# Patient Record
Sex: Male | Born: 1995 | Race: Black or African American | Hispanic: No | Marital: Single | Smoking: Current every day smoker
Health system: Southern US, Community
[De-identification: ages and names within clinical notes are randomized; demographics above are authoritative.]

## PROBLEM LIST (undated history)

## (undated) DIAGNOSIS — J45909 Unspecified asthma, uncomplicated: Secondary | ICD-10-CM

---
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
## 2017-02-13 ENCOUNTER — Emergency Department (HOSPITAL_COMMUNITY): Payer: Self-pay

## 2017-02-13 ENCOUNTER — Encounter (HOSPITAL_COMMUNITY): Payer: Self-pay

## 2017-02-13 ENCOUNTER — Emergency Department (HOSPITAL_COMMUNITY)
Admission: EM | Admit: 2017-02-13 | Discharge: 2017-02-13 | Disposition: A | Discharge status: Home / Self Care | Payer: Self-pay | Attending: Emergency Medicine | Admitting: Emergency Medicine

## 2017-02-13 DIAGNOSIS — J111 Influenza due to unidentified influenza virus with other respiratory manifestations: Secondary | ICD-10-CM | POA: Insufficient documentation

## 2017-02-13 DIAGNOSIS — R10817 Generalized abdominal tenderness: Secondary | ICD-10-CM | POA: Insufficient documentation

## 2017-02-13 DIAGNOSIS — R112 Nausea with vomiting, unspecified: Secondary | ICD-10-CM

## 2017-02-13 LAB — CBC WITH DIFFERENTIAL/PLATELET
BASOS ABS: 0 10*3/uL (ref 0.0–0.1)
Basophils Relative: 0 %
EOS PCT: 0 %
Eosinophils Absolute: 0 10*3/uL (ref 0.0–0.7)
HCT: 47.8 % (ref 39.0–52.0)
Hemoglobin: 17.2 g/dL — ABNORMAL HIGH (ref 13.0–17.0)
LYMPHS ABS: 1.3 10*3/uL (ref 0.7–4.0)
LYMPHS PCT: 9 %
MCH: 30.7 pg (ref 26.0–34.0)
MCHC: 36 g/dL (ref 30.0–36.0)
MCV: 85.4 fL (ref 78.0–100.0)
MONO ABS: 1.2 10*3/uL — AB (ref 0.1–1.0)
MONOS PCT: 9 %
NEUTROS ABS: 11.2 10*3/uL — AB (ref 1.7–7.7)
Neutrophils Relative %: 82 %
PLATELETS: 179 10*3/uL (ref 150–400)
RBC: 5.6 MIL/uL (ref 4.22–5.81)
RDW: 12.6 % (ref 11.5–15.5)
WBC: 13.7 10*3/uL — ABNORMAL HIGH (ref 4.0–10.5)

## 2017-02-13 LAB — COMPREHENSIVE METABOLIC PANEL
ALBUMIN: 4.6 g/dL (ref 3.5–5.0)
ALT: 21 U/L (ref 17–63)
ANION GAP: 17 — AB (ref 5–15)
AST: 24 U/L (ref 15–41)
Alkaline Phosphatase: 44 U/L (ref 38–126)
BILIRUBIN TOTAL: 0.7 mg/dL (ref 0.3–1.2)
BUN: 17 mg/dL (ref 6–20)
CHLORIDE: 94 mmol/L — AB (ref 101–111)
CO2: 25 mmol/L (ref 22–32)
Calcium: 9.5 mg/dL (ref 8.9–10.3)
Creatinine, Ser: 1.36 mg/dL — ABNORMAL HIGH (ref 0.61–1.24)
GFR calc Af Amer: >60 mL/min (ref >60)
Glucose, Bld: 101 mg/dL — ABNORMAL HIGH (ref 65–99)
POTASSIUM: 3.1 mmol/L — AB (ref 3.5–5.1)
Sodium: 136 mmol/L (ref 135–145)
TOTAL PROTEIN: 7.9 g/dL (ref 6.5–8.1)

## 2017-02-13 LAB — URINALYSIS, ROUTINE W REFLEX MICROSCOPIC
Bacteria, UA: NONE SEEN
Bilirubin Urine: NEGATIVE
Glucose, UA: NEGATIVE mg/dL
HGB URINE DIPSTICK: NEGATIVE
Ketones, ur: 20 mg/dL — AB
LEUKOCYTES UA: NEGATIVE
Nitrite: NEGATIVE
Protein, ur: 30 mg/dL — AB
SPECIFIC GRAVITY, URINE: 1.015 (ref 1.005–1.030)
pH: 7 (ref 5.0–8.0)

## 2017-02-13 MED ORDER — SODIUM CHLORIDE 0.9 % IV BOLUS (SEPSIS)
1000.0000 mL | Freq: Once | INTRAVENOUS | Status: AC
  Administered 2017-02-13: 1000 mL via INTRAVENOUS

## 2017-02-13 MED ORDER — PROMETHAZINE HCL 25 MG PO TABS: 25.0000 mg | ORAL_TABLET | Freq: Four times a day (QID) | ORAL | 0 refills | Status: DC | PRN

## 2017-02-13 MED ORDER — PROMETHAZINE HCL 25 MG/ML IJ SOLN
12.5000 mg | Freq: Once | INTRAMUSCULAR | Status: AC
  Administered 2017-02-13: 12.5 mg via INTRAVENOUS
  Filled 2017-02-13: qty 1

## 2017-02-13 MED ORDER — KETOROLAC TROMETHAMINE 30 MG/ML IJ SOLN
30.0000 mg | Freq: Once | INTRAMUSCULAR | Status: AC
  Administered 2017-02-13: 30 mg via INTRAVENOUS
  Filled 2017-02-13: qty 1

## 2017-02-13 MED ORDER — HALOPERIDOL LACTATE 5 MG/ML IJ SOLN
2.0000 mg | Freq: Once | INTRAMUSCULAR | Status: AC
  Administered 2017-02-13: 2 mg via INTRAVENOUS
  Filled 2017-02-13: qty 1

## 2017-02-13 MED ORDER — IBUPROFEN 600 MG PO TABS: 600.0000 mg | ORAL_TABLET | Freq: Four times a day (QID) | ORAL | 0 refills | Status: DC | PRN

## 2017-02-13 MED ORDER — ACETAMINOPHEN 325 MG PO TABS
650.0000 mg | ORAL_TABLET | Freq: Once | ORAL | Status: AC
  Administered 2017-02-13: 650 mg via ORAL
  Filled 2017-02-13: qty 2

## 2017-02-13 NOTE — Discharge Instructions (Signed)
Take phenergan as prescribed as needed for nausea and vomiting. Continue tamiflu. Avoid smoking marijuana. Ibuprofen and tylenol for pain. Return if worsening symptoms, unable to keep anything down.

## 2017-02-13 NOTE — ED Triage Notes (Signed)
Pt states he was dx with influenza yesterday. Has had symptoms X5 days. c/o vomiting, chest pain, back pain, and abd pain.

## 2017-02-13 NOTE — ED Notes (Signed)
Pt called out x2 for nausea meds -- PA notified. Pt sts "I threw up a little in the trash can"

## 2017-02-13 NOTE — ED Notes (Signed)
Pt given ginger ale per request PA

## 2017-02-13 NOTE — ED Notes (Signed)
Pt stable, ambulatory, states understanding of discharge instructions 

## 2017-02-13 NOTE — ED Provider Notes (Signed)
[facility]-EMERGENCY DEPT Provider Note   CSN: [id number] Arrival date & time: 02/13/17  5621     History   Chief Complaint No chief complaint on file.   HPI Blake Jennings is a 21 y.o. male.  HPI Blake Jennings is a 21 y.o. male with no medical problems, presents to emergency department complaining of nausea and vomiting. Patient states he developed symptoms 3 days ago. He went to urgent care yesterday where he tested positive for influenza a. He was given Zofran prescription and Tamiflu. He states that he is having mild nasal congestion, cough, nausea, vomiting, diarrhea just 2 days ago which now resolved. He states he has had approximately 20 episodes of emesis since yesterday. He is unable to keep anything down. He was given prescription for 10 x 4mg  Zofran's and he states he took all of them since last night. He had no relief with Zofran. He has not tried taking any Tylenol or Motrin. He reports pain to his chest, back, abdomen. He admits to smoking marijuana. He denies any fever. He admits to chills. Denies any blood in his stool or emesis.    No past medical history on file.  There are no active problems to display for this patient.   No past surgical history on file.     Home Medications    Prior to Admission medications   Medication Sig Start Date End Date Taking? Authorizing Provider  acetaminophen (TYLENOL) 500 MG tablet Take 500 mg by mouth every 6 (six) hours as needed for mild pain.    Historical Provider, MD  ibuprofen (ADVIL,MOTRIN) 200 MG tablet Take 200 mg by mouth every 6 (six) hours as needed for moderate pain.    Historical Provider, MD  promethazine (PHENERGAN) 25 MG tablet Take 1 tablet (25 mg total) by mouth every 6 (six) hours as needed for nausea or vomiting. 07/21/15   Laurence Spates, MD    Family History No family history on file.  Social History Social History  Substance Use Topics  . Smoking status: Never Smoker  . Smokeless  tobacco: Not on file  . Alcohol use No     Allergies   Patient has no known allergies.   Review of Systems Review of Systems  Constitutional: Positive for chills. Negative for fever.  HENT: Positive for congestion.   Respiratory: Positive for cough. Negative for chest tightness and shortness of breath.   Cardiovascular: Negative for chest pain, palpitations and leg swelling.  Gastrointestinal: Positive for abdominal pain, nausea and vomiting. Negative for abdominal distention and diarrhea.  Genitourinary: Negative for dysuria, frequency, hematuria and urgency.  Musculoskeletal: Positive for arthralgias, back pain and myalgias. Negative for neck pain and neck stiffness.  Skin: Negative for rash.  Allergic/Immunologic: Negative for immunocompromised state.  Neurological: Negative for dizziness, weakness, light-headedness, numbness and headaches.  All other systems reviewed and are negative.    Physical Exam Updated Vital Signs BP (!) 142/90 (BP Location: Right Arm)   Pulse 73   Temp 97.7 F (36.5 C) (Oral)   Resp (!) 22   SpO2 98%   Physical Exam  Constitutional: He appears well-developed and well-nourished. No distress.  HENT:  Head: Normocephalic and atraumatic.  Eyes: Conjunctivae are normal.  Neck: Neck supple.  Cardiovascular: Normal rate, regular rhythm and normal heart sounds.   Pulmonary/Chest: Effort normal. No respiratory distress. He has no wheezes. He has no rales.  Abdominal: Soft. Bowel sounds are normal. He exhibits no distension. There is tenderness. There is no  rebound and no guarding.  Diffuse tenderness  Musculoskeletal: He exhibits no edema.  Neurological: He is alert.  Skin: Skin is warm and dry.  Nursing note and vitals reviewed.    ED Treatments / Results  Labs (all labs ordered are listed, but only abnormal results are displayed) Labs Reviewed  CBC WITH DIFFERENTIAL/PLATELET - Abnormal; Notable for the following:       Result Value   WBC  13.7 (*)    Hemoglobin 17.2 (*)    Neutro Abs 11.2 (*)    Monocytes Absolute 1.2 (*)    All other components within normal limits  COMPREHENSIVE METABOLIC PANEL - Abnormal; Notable for the following:    Potassium 3.1 (*)    Chloride 94 (*)    Glucose, Bld 101 (*)    Creatinine, Ser 1.36 (*)    Anion gap 17 (*)    All other components within normal limits  URINALYSIS, ROUTINE W REFLEX MICROSCOPIC - Abnormal; Notable for the following:    Ketones, ur 20 (*)    Protein, ur 30 (*)    Squamous Epithelial / LPF 0-5 (*)    All other components within normal limits    EKG  EKG Interpretation  Date/Time:  Sunday February 13 2017 09:39:10 EDT Ventricular Rate:  71 PR Interval:    QRS Duration: 82 QT Interval:  401 QTC Calculation: 436 R Axis:   84 Text Interpretation:  Sinus rhythm Borderline Q waves in lateral leads Borderline ST elevation, lateral leads No significant change since last tracing Confirmed by LITTLE MD, RACHEL [phone]) on 02/13/2017 10:01:53 AM       Radiology Dg Chest 2 View  Result Date: 02/13/2017 CLINICAL DATA:  Influenza.  Chest pain EXAM: CHEST  2 VIEW COMPARISON:  July 21, 2015 FINDINGS: Lungs are clear. Heart size and pulmonary vascularity are normal. No adenopathy. No pneumothorax. No bone lesions. IMPRESSION: No edema or consolidation. Electronically Signed   By: Bretta Bang III M.D.   On: 02/13/2017 10:04    Procedures Procedures (including critical care time)  Medications Ordered in ED Medications  promethazine (PHENERGAN) injection 12.5 mg (12.5 mg Intravenous Given 02/13/17 0932)  sodium chloride 0.9 % bolus 1,000 mL (0 mLs Intravenous Stopped 02/13/17 1017)  ketorolac (TORADOL) 30 MG/ML injection 30 mg (30 mg Intravenous Given 02/13/17 0934)  sodium chloride 0.9 % bolus 1,000 mL (0 mLs Intravenous Stopped 02/13/17 1150)  haloperidol lactate (HALDOL) injection 2 mg (2 mg Intravenous Given 02/13/17 1318)  sodium chloride 0.9 % bolus 1,000 mL (0 mLs  Intravenous Stopped 02/13/17 1452)  acetaminophen (TYLENOL) tablet 650 mg (650 mg Oral Given 02/13/17 1402)     Initial Impression / Assessment and Plan / ED Course  I have reviewed the triage vital signs and the nursing notes.  Pertinent labs & imaging results that were available during my care of the patient were reviewed by me and considered in my medical decision making (see chart for details).    Patient diagnosed with influenza yesterday, currently on Tamiflu and Zofran. He presents to emergency room for intractable vomiting. He is rolling around in bed moaning, normal vital signs, abdomen is benign. He does admit to smoking marijuana and admits to similar symptoms in the past. We will check labs, hydrate, try Phenergan for nausea and vomiting since patient took too many Zofran at home. We'll check an EKG.   Patient initially improved with IV fluids and Phenergan. He took a few sips of ginger ale and began having  nausea again. He again rolling around in bed moaning. Grandma at bedside, states patient sits in the hot shower all day and does not come out. I question whether his vomiting is due to [facility] induced cyclical vomiting. We will try Haldol.   3:44 PM Feels better after Haldol. Drinking ginger ale. Drank a cup of water and now drinking second cup ginger ale. Plan to discharge home. Instructed to continue Tamiflu, will give prescription for Phenergan, return precautions discussed. Potassium slightly low, advised to increase potassium intake. Most likely from vomiting. VS normal. Pt stable for dc home.   Vitals:   02/13/17 1200 02/13/17 1330 02/13/17 1500 02/13/17 1516  BP: 125/75 140/84 (!) 110/49 120/60  Pulse: (!) 57 (!) 55 63 (!) 57  Resp: 16 16 18    Temp:      TempSrc:      SpO2: 96% 99% 98% 100%     Final Clinical Impressions(s) / ED Diagnoses   Final diagnoses:  Nausea and vomiting, intractability of vomiting not specified, unspecified vomiting type  Influenza     New Prescriptions Discharge Medication List as of 02/13/2017  3:11 PM    START taking these medications   Details  !! ibuprofen (ADVIL,MOTRIN) 600 MG tablet Take 1 tablet (600 mg total) by mouth every 6 (six) hours as needed., Starting Sun 02/13/2017, Print    !! promethazine (PHENERGAN) 25 MG tablet Take 1 tablet (25 mg total) by mouth every 6 (six) hours as needed for nausea or vomiting., Starting Sun 02/13/2017, Print     !! - Potential duplicate medications found. Please discuss with provider.       Jaynie Crumbleatyana Irmgard Rampersaud, PA-C 02/13/17 1545    Laurence Spatesachel Morgan Little, MD 02/13/17 [phone]

## 2017-02-14 ENCOUNTER — Emergency Department (HOSPITAL_COMMUNITY)
Admission: EM | Admit: 2017-02-14 | Discharge: 2017-02-14 | Disposition: A | Discharge status: Home / Self Care | Payer: Medicaid Other | Attending: Emergency Medicine | Admitting: Emergency Medicine

## 2017-02-14 ENCOUNTER — Encounter (HOSPITAL_COMMUNITY): Payer: Self-pay | Admitting: Emergency Medicine

## 2017-02-14 ENCOUNTER — Encounter (HOSPITAL_COMMUNITY): Payer: Self-pay

## 2017-02-14 DIAGNOSIS — F17210 Nicotine dependence, cigarettes, uncomplicated: Secondary | ICD-10-CM | POA: Insufficient documentation

## 2017-02-14 DIAGNOSIS — E860 Dehydration: Secondary | ICD-10-CM | POA: Insufficient documentation

## 2017-02-14 DIAGNOSIS — J45909 Unspecified asthma, uncomplicated: Secondary | ICD-10-CM | POA: Insufficient documentation

## 2017-02-14 DIAGNOSIS — R112 Nausea with vomiting, unspecified: Secondary | ICD-10-CM

## 2017-02-14 DIAGNOSIS — J101 Influenza due to other identified influenza virus with other respiratory manifestations: Secondary | ICD-10-CM

## 2017-02-14 DIAGNOSIS — M62838 Other muscle spasm: Secondary | ICD-10-CM | POA: Insufficient documentation

## 2017-02-14 DIAGNOSIS — Z79899 Other long term (current) drug therapy: Secondary | ICD-10-CM | POA: Insufficient documentation

## 2017-02-14 DIAGNOSIS — J09X2 Influenza due to identified novel influenza A virus with other respiratory manifestations: Secondary | ICD-10-CM | POA: Insufficient documentation

## 2017-02-14 HISTORY — DX: Unspecified asthma, uncomplicated: J45.909

## 2017-02-14 LAB — BASIC METABOLIC PANEL
ANION GAP: 6 (ref 5–15)
BUN: 9 mg/dL (ref 6–20)
CALCIUM: 8.7 mg/dL — AB (ref 8.9–10.3)
CO2: 25 mmol/L (ref 22–32)
CREATININE: 0.94 mg/dL (ref 0.61–1.24)
Chloride: 106 mmol/L (ref 101–111)
Glucose, Bld: 80 mg/dL (ref 65–99)
Potassium: 3.7 mmol/L (ref 3.5–5.1)
SODIUM: 137 mmol/L (ref 135–145)

## 2017-02-14 LAB — CBC WITH DIFFERENTIAL/PLATELET
BASOS ABS: 0.1 10*3/uL (ref 0.0–0.1)
Basophils Absolute: 0 10*3/uL (ref 0.0–0.1)
Basophils Relative: 1 %
Basophils Relative: 0 %
EOS ABS: 0.1 10*3/uL (ref 0.0–0.7)
Eosinophils Absolute: 0 10*3/uL (ref 0.0–0.7)
Eosinophils Relative: 0 %
Eosinophils Relative: 1 %
HCT: 40.6 % (ref 39.0–52.0)
HEMATOCRIT: 44.8 % (ref 39.0–52.0)
Hemoglobin: 15.7 g/dL (ref 13.0–17.0)
Hemoglobin: 14.3 g/dL (ref 13.0–17.0)
LYMPHS ABS: 1.6 10*3/uL (ref 0.7–4.0)
LYMPHS ABS: 3.7 10*3/uL (ref 0.7–4.0)
Lymphocytes Relative: 19 %
Lymphocytes Relative: 40 %
MCH: 30.5 pg (ref 26.0–34.0)
MCH: 30.4 pg (ref 26.0–34.0)
MCHC: 35 g/dL (ref 30.0–36.0)
MCHC: 35.2 g/dL (ref 30.0–36.0)
MCV: 87 fL (ref 78.0–100.0)
MCV: 86.2 fL (ref 78.0–100.0)
MONO ABS: 0.8 10*3/uL (ref 0.1–1.0)
MONO ABS: 0.8 10*3/uL (ref 0.1–1.0)
Monocytes Relative: 10 %
Monocytes Relative: 9 %
NEUTROS ABS: 5.8 10*3/uL (ref 1.7–7.7)
NEUTROS PCT: 50 %
Neutro Abs: 4.6 10*3/uL (ref 1.7–7.7)
Neutrophils Relative %: 70 %
PLATELETS: 167 10*3/uL (ref 150–400)
PLATELETS: 177 10*3/uL (ref 150–400)
RBC: 5.15 MIL/uL (ref 4.22–5.81)
RBC: 4.71 MIL/uL (ref 4.22–5.81)
RDW: 12.8 % (ref 11.5–15.5)
RDW: 12.6 % (ref 11.5–15.5)
WBC: 8.3 10*3/uL (ref 4.0–10.5)
WBC: 9.2 10*3/uL (ref 4.0–10.5)

## 2017-02-14 LAB — COMPREHENSIVE METABOLIC PANEL
ALBUMIN: 4 g/dL (ref 3.5–5.0)
ALT: 19 U/L (ref 17–63)
ANION GAP: 12 (ref 5–15)
AST: 20 U/L (ref 15–41)
Alkaline Phosphatase: 36 U/L — ABNORMAL LOW (ref 38–126)
BILIRUBIN TOTAL: 0.9 mg/dL (ref 0.3–1.2)
BUN: 10 mg/dL (ref 6–20)
CHLORIDE: 97 mmol/L — AB (ref 101–111)
CO2: 27 mmol/L (ref 22–32)
Calcium: 8.7 mg/dL — ABNORMAL LOW (ref 8.9–10.3)
Creatinine, Ser: 1.02 mg/dL (ref 0.61–1.24)
GFR calc Af Amer: >60 mL/min (ref >60)
GLUCOSE: 96 mg/dL (ref 65–99)
POTASSIUM: 3.2 mmol/L — AB (ref 3.5–5.1)
Sodium: 136 mmol/L (ref 135–145)
TOTAL PROTEIN: 6.8 g/dL (ref 6.5–8.1)

## 2017-02-14 LAB — CK: CK TOTAL: 297 U/L (ref 49–397)

## 2017-02-14 LAB — TROPONIN I: Troponin I: <0.03 ng/mL (ref <0.03)

## 2017-02-14 LAB — LIPASE, BLOOD

## 2017-02-14 MED ORDER — HALOPERIDOL LACTATE 5 MG/ML IJ SOLN
5.0000 mg | Freq: Once | INTRAMUSCULAR | Status: AC
  Administered 2017-02-14: 5 mg via INTRAVENOUS
  Filled 2017-02-14: qty 1

## 2017-02-14 MED ORDER — DEXTROSE-NACL 5-0.45 % IV SOLN
INTRAVENOUS | Status: DC
  Administered 2017-02-14: 11:00:00 via INTRAVENOUS

## 2017-02-14 MED ORDER — SODIUM CHLORIDE 0.9 % IV BOLUS (SEPSIS)
1000.0000 mL | Freq: Once | INTRAVENOUS | Status: AC
  Administered 2017-02-14: 1000 mL via INTRAVENOUS

## 2017-02-14 MED ORDER — ONDANSETRON 4 MG PO TBDP: 4.0000 mg | ORAL_TABLET | Freq: Three times a day (TID) | ORAL | 0 refills | Status: DC | PRN

## 2017-02-14 MED ORDER — DIPHENHYDRAMINE HCL 50 MG/ML IJ SOLN
50.0000 mg | Freq: Once | INTRAMUSCULAR | Status: AC
  Administered 2017-02-14: 50 mg via INTRAVENOUS
  Filled 2017-02-14: qty 1

## 2017-02-14 MED ORDER — ALBUTEROL SULFATE HFA 108 (90 BASE) MCG/ACT IN AERS
2.0000 | INHALATION_SPRAY | Freq: Once | RESPIRATORY_TRACT | Status: AC
  Administered 2017-02-14: 2 via RESPIRATORY_TRACT
  Filled 2017-02-14: qty 6.7

## 2017-02-14 MED ORDER — DIAZEPAM 5 MG/ML IJ SOLN
5.0000 mg | Freq: Once | INTRAMUSCULAR | Status: AC
  Administered 2017-02-14: 5 mg via INTRAVENOUS
  Filled 2017-02-14: qty 2

## 2017-02-14 MED ORDER — POTASSIUM CHLORIDE CRYS ER 20 MEQ PO TBCR
40.0000 meq | EXTENDED_RELEASE_TABLET | Freq: Once | ORAL | Status: AC
  Administered 2017-02-14: 40 meq via ORAL
  Filled 2017-02-14: qty 2

## 2017-02-14 NOTE — ED Notes (Signed)
Pt placed on droplet precautions due to being started on Tamiflu on Friday for the flu at urgent care

## 2017-02-14 NOTE — ED Triage Notes (Signed)
Patient requesting re-evaluation of ongoing cough and nausea with vomiting. States that he is continuing to vomit with any intake and taking zofran and phenergan with no relief. Alert and oriented, NAD

## 2017-02-14 NOTE — ED Triage Notes (Signed)
Per EMS, patient has had flu x3 days. Patient has been [facility] ER today. Patient is complaining of generalized body aches and congestion. Patient is also complaining of leg cramps/spasms.

## 2017-02-14 NOTE — ED Provider Notes (Signed)
[facility]-EMERGENCY DEPT Provider Note   CSN: [id number] Arrival date & time: 02/14/17  1904  By signing my name below, I, Blake Jennings, attest that this documentation has been prepared under the direction and in the presence of [name], PA-C.  Electronically Signed: Rosario AdieWilliam Andrew Jennings, ED Scribe. 02/14/17. 8:02 PM.  History   Chief Complaint Chief Complaint  Patient presents with  . Flu Like Symptoms   The history is provided by the patient, a relative and medical records. No language interpreter was used.   HPI Comments: Nada Blake Jennings is a 21 y.o. male brought in by ambulance, with a h/o asthma, who presents to the Emergency Department complaining of persistent leg spasming beginning approximately 4-5 hours ago. He reports associated lower back tightness secondary to this issue. Pt recently tested positive for influenza A. He was prescribed Tamiflu and Zofran at that time which he began taking yesterday with significant improvement of all of his previous symptoms. He was also seen yesterday and again this morning in the ED for persistent nausea which has resolved with IV NSL bolus and Haldol; CBC, CMP, Lipase, and Troponin were tested this morning and were WNL. Shortly following being seen in the ED most recently he states that he began having an acute onset of bilateral lower leg muscle spasms and pain to the area which has been persistent. No h/o similar symptoms. No h/o seizures. His symptoms are alleviated with standing and movement of the legs.Grandparent denies speech changes, confusion. He denies headache, rash, syncope, visual disturbance, or any other associated symptoms.   Past Medical History:  Diagnosis Date  . Asthma    There are no active problems to display for this patient.  History reviewed. No pertinent surgical history.  Home Medications    Prior to Admission medications   Medication Sig Start Date End Date Taking? Authorizing Provider    acetaminophen (TYLENOL) 500 MG tablet Take 500 mg by mouth every 6 (six) hours as needed for mild pain.    Historical Provider, MD  ibuprofen (ADVIL,MOTRIN) 200 MG tablet Take 200 mg by mouth every 6 (six) hours as needed for moderate pain.    Historical Provider, MD  ibuprofen (ADVIL,MOTRIN) 600 MG tablet Take 1 tablet (600 mg total) by mouth every 6 (six) hours as needed. 02/13/17   Tatyana Kirichenko, PA-C  ondansetron (ZOFRAN ODT) 4 MG disintegrating tablet Take 1 tablet (4 mg total) by mouth every 8 (eight) hours as needed for nausea or vomiting. 02/14/17   Alvira MondayErin Schlossman, MD  promethazine (PHENERGAN) 25 MG tablet Take 1 tablet (25 mg total) by mouth every 6 (six) hours as needed for nausea or vomiting. 07/21/15   Laurence Spatesachel Morgan Little, MD  promethazine (PHENERGAN) 25 MG tablet Take 1 tablet (25 mg total) by mouth every 6 (six) hours as needed for nausea or vomiting. 02/13/17   Jaynie Crumbleatyana Kirichenko, PA-C   Family History No family history on file.  Social History Social History  Substance Use Topics  . Smoking status: Current Every Day Smoker    Types: Cigarettes  . Smokeless tobacco: Never Used  . Alcohol use No   Allergies   Patient has no known allergies.  Review of Systems Review of Systems  Constitutional: Negative for fever.  HENT: Negative for congestion.   Eyes: Negative for visual disturbance.  Cardiovascular: Negative for chest pain.  Gastrointestinal: Negative for diarrhea, nausea and vomiting.  Musculoskeletal: Positive for back pain (lower) and myalgias.       B/l leg  spasms  Skin: Negative for rash.  Neurological: Negative for seizures and speech difficulty.  Psychiatric/Behavioral: Negative for confusion.   Physical Exam Updated Vital Signs BP 130/63 (BP Location: Right Arm)   Pulse 86   Temp 98.2 F (36.8 C) (Oral)   Resp 18   Ht 6' (1.829 m)   Wt 68.5 kg   SpO2 97%   BMI 20.48 kg/m   Physical Exam  Constitutional: He appears well-developed and  well-nourished. No distress.  HENT:  Head: Normocephalic and atraumatic.  Eyes: Conjunctivae are normal.  Neck: Normal range of motion.  Cardiovascular: Normal rate, regular rhythm, normal heart sounds and intact distal pulses.   No murmur heard. Pulmonary/Chest: Effort normal and breath sounds normal. No respiratory distress. He has no wheezes. He has no rales.  Abdominal: Soft. Bowel sounds are normal. He exhibits no distension. There is no tenderness.  Musculoskeletal: Normal range of motion.  Pts b/l legs w intermittent tonic spasms on exam.   Neurological: He is alert. He displays normal reflexes. No cranial nerve deficit or sensory deficit.  NV intact.    Skin: Skin is warm and dry. No pallor.  Psychiatric: He has a normal mood and affect. His behavior is normal.  Nursing note and vitals reviewed.  ED Treatments / Results  DIAGNOSTIC STUDIES: Oxygen Saturation is 99% on RA, normal by my interpretation.   COORDINATION OF CARE: 7:58 PM-Discussed next steps with pt. Pt verbalized understanding and is agreeable with the plan.   Labs (all labs ordered are listed, but only abnormal results are displayed) Labs Reviewed  BASIC METABOLIC PANEL - Abnormal; Notable for the following:       Result Value   Calcium 8.7 (*)    All other components within normal limits  CK  CBC WITH DIFFERENTIAL/PLATELET    EKG  EKG Interpretation None      Radiology Dg Chest 2 View  Result Date: 02/13/2017 CLINICAL DATA:  Influenza.  Chest pain EXAM: CHEST  2 VIEW COMPARISON:  July 21, 2015 FINDINGS: Lungs are clear. Heart size and pulmonary vascularity are normal. No adenopathy. No pneumothorax. No bone lesions. IMPRESSION: No edema or consolidation. Electronically Signed   By: Bretta Bang III M.D.   On: 02/13/2017 10:04   Procedures Procedures   Medications Ordered in ED Medications  sodium chloride 0.9 % bolus 1,000 mL (0 mLs Intravenous Stopped 02/14/17 2108)  diphenhydrAMINE  (BENADRYL) injection 50 mg (50 mg Intravenous Given 02/14/17 2128)  diazepam (VALIUM) injection 5 mg (5 mg Intravenous Given 02/14/17 2128)    Initial Impression / Assessment and Plan / ED Course  I have reviewed the triage vital signs and the nursing notes.  Pertinent labs & imaging results that were available during my care of the patient were reviewed by me and considered in my medical decision making (see chart for details).     Pt w influenza virus and recent episodes of vomiting, presents w b/l muscular spasms in legs. Normal neuro exam, pt not in distress. Suspect muscle spasms 2/t dehydration vs adverse reaction from Haldol. Labs reassuring. Sx resolved after IVF, benadryl, valium. Provided reassurance, encourage PO fluids. Pt safe for discharge home.   Patient discussed with Dr. Fayrene Fearing.  Discussed results, findings, treatment and follow up. Patient advised of return precautions. Patient verbalized understanding and agreed with plan.   Final Clinical Impressions(s) / ED Diagnoses   Final diagnoses:  Muscle spasm of both lower legs   New Prescriptions New Prescriptions   No  medications on file   I personally performed the services described in this documentation, which was scribed in my presence. The recorded information has been reviewed and is accurate.     [place] Nicole Russo, PA-C 02/14/17 2228    Rolland Porter, MD 02/23/17 [phone]

## 2017-02-14 NOTE — ED Notes (Signed)
Provided warm blanket.

## 2017-02-14 NOTE — ED Notes (Signed)
Pt calmly laying in bed, normal breathing, relaxed and appears comfortable.  Pt has not coughed or thrown up since arrival.

## 2017-02-14 NOTE — ED Provider Notes (Signed)
[facility]-EMERGENCY DEPT Provider Note   CSN: [id number] Arrival date & time: 02/14/17  0751     History   Chief Complaint Chief Complaint  Patient presents with  . emesis/cough    HPI Blake Jennings is a 21 y.o. male.  HPI   Was seen yesterday with n/v/cough.  Symptoms started 5 days ago, 4 days ago went to urgent care, had influenza a, given zofran and tamiflu.  Throwing up 10 times per day, bright red drops of blood in emesis. Diarrhea Friday not after that.  Chest pain from vomiting started Friday.  Taking a shower helps the chest pain and helps the nausea a little bit. Not coughing up anything, feels cough is unchanged. No fevers now. Haven't been able to keep anything down since yesterday.  Came in yesterday. Taking phenergan and zofran without change in nausea.   Past Medical History:  Diagnosis Date  . Asthma     There are no active problems to display for this patient.   History reviewed. No pertinent surgical history.     Home Medications    Prior to Admission medications   Medication Sig Start Date End Date Taking? Authorizing Provider  acetaminophen (TYLENOL) 500 MG tablet Take 500 mg by mouth every 6 (six) hours as needed for mild pain.    Historical Provider, MD  ibuprofen (ADVIL,MOTRIN) 200 MG tablet Take 200 mg by mouth every 6 (six) hours as needed for moderate pain.    Historical Provider, MD  ibuprofen (ADVIL,MOTRIN) 600 MG tablet Take 1 tablet (600 mg total) by mouth every 6 (six) hours as needed. 02/13/17   Tatyana Kirichenko, PA-C  ondansetron (ZOFRAN ODT) 4 MG disintegrating tablet Take 1 tablet (4 mg total) by mouth every 8 (eight) hours as needed for nausea or vomiting. 02/14/17   Alvira Monday, MD  promethazine (PHENERGAN) 25 MG tablet Take 1 tablet (25 mg total) by mouth every 6 (six) hours as needed for nausea or vomiting. 07/21/15   Laurence Spates, MD  promethazine (PHENERGAN) 25 MG tablet Take 1 tablet (25 mg total) by mouth every 6 (six)  hours as needed for nausea or vomiting. 02/13/17   Jaynie Crumble, PA-C    Family History No family history on file.  Social History Social History  Substance Use Topics  . Smoking status: Current Every Day Smoker    Types: Cigarettes  . Smokeless tobacco: Never Used  . Alcohol use No     Allergies   Patient has no known allergies.   Review of Systems Review of Systems  Constitutional: Positive for fatigue. Negative for fever.  HENT: Negative for sore throat.   Eyes: Negative for visual disturbance.  Respiratory: Positive for cough. Negative for shortness of breath.   Cardiovascular: Positive for chest pain.  Gastrointestinal: Positive for nausea and vomiting. Negative for abdominal pain, constipation and diarrhea.  Genitourinary: Negative for difficulty urinating and dysuria.  Musculoskeletal: Positive for back pain (lower back). Negative for neck stiffness.  Skin: Negative for rash.  Neurological: Negative for syncope and headaches.     Physical Exam Updated Vital Signs BP (!) 108/57 (BP Location: Right Arm)   Pulse (!) 53   Temp 98.7 F (37.1 C) (Oral)   Resp 16   Ht 6' (1.829 m)   Wt 151 lb (68.5 kg)   SpO2 100%   BMI 20.48 kg/m   Physical Exam  Constitutional: He is oriented to person, place, and time. He appears well-developed and well-nourished. He appears ill. No  distress.  HENT:  Head: Normocephalic and atraumatic.  Eyes: Conjunctivae and EOM are normal.  Neck: Normal range of motion.  Cardiovascular: Normal rate, regular rhythm, normal heart sounds and intact distal pulses.  Exam reveals no gallop and no friction rub.   No murmur heard. Pulmonary/Chest: Effort normal and breath sounds normal. No respiratory distress. He has no wheezes. He has no rales.  Abdominal: Soft. He exhibits no distension. There is no tenderness. There is no guarding.  Musculoskeletal: He exhibits no edema.  Neurological: He is alert and oriented to person, place, and  time.  Skin: Skin is warm and dry. He is not diaphoretic.  Nursing note and vitals reviewed.    ED Treatments / Results  Labs (all labs ordered are listed, but only abnormal results are displayed) Labs Reviewed  COMPREHENSIVE METABOLIC PANEL - Abnormal; Notable for the following:       Result Value   Potassium 3.2 (*)    Chloride 97 (*)    Calcium 8.7 (*)    Alkaline Phosphatase 36 (*)    All other components within normal limits  LIPASE, BLOOD - Abnormal; Notable for the following:    Lipase <10 (*)    All other components within normal limits  CBC WITH DIFFERENTIAL/PLATELET  TROPONIN I    EKG  EKG Interpretation  Date/Time:  Monday February 14 2017 10:11:10 EDT Ventricular Rate:  57 PR Interval:    QRS Duration: 80 QT Interval:  431 QTC Calculation: 420 R Axis:   65 Text Interpretation:  Sinus rhythm Borderline Q waves in lateral leads No significant change since last tracing Confirmed by [facility] MD, Toinette Lackie ([id number]) on 02/14/2017 10:34:57 AM       Radiology Dg Chest 2 View  Result Date: 02/13/2017 CLINICAL DATA:  Influenza.  Chest pain EXAM: CHEST  2 VIEW COMPARISON:  July 21, 2015 FINDINGS: Lungs are clear. Heart size and pulmonary vascularity are normal. No adenopathy. No pneumothorax. No bone lesions. IMPRESSION: No edema or consolidation. Electronically Signed   By: Bretta BangWilliam  Woodruff III M.D.   On: 02/13/2017 10:04    Procedures Procedures (including critical care time)  Medications Ordered in ED Medications  sodium chloride 0.9 % bolus 1,000 mL (0 mLs Intravenous Stopped 02/14/17 1039)  haloperidol lactate (HALDOL) injection 5 mg (5 mg Intravenous Given 02/14/17 0903)  albuterol (PROVENTIL HFA;VENTOLIN HFA) 108 (90 Base) MCG/ACT inhaler 2 puff (2 puffs Inhalation Given 02/14/17 1120)  sodium chloride 0.9 % bolus 1,000 mL (0 mLs Intravenous Stopped 02/14/17 1323)  potassium chloride SA (K-DUR,KLOR-CON) CR tablet 40 mEq (40 mEq Oral Given 02/14/17 1346)      Initial Impression / Assessment and Plan / ED Course  I have reviewed the triage vital signs and the nursing notes.  Pertinent labs & imaging results that were available during my care of the patient were reviewed by me and considered in my medical decision making (see chart for details).     21yo male presents with concern for nausea and vomiting. Diagnosed with influenza A at Urgent care and seen yesterday for nausea/vomiting, likely secondary to influenza, side effect of tamiflu, or cannabinoid hyperemesis.  Patient given 2L of NS, as well as D5.45NS influsion and haldol IV. He was observed in the ED with no emesis, no coughing. Had CXR yesterday which was negative for pneumonia, urinalysis yesterday negative for infection. Labs repeated showing mild hypokalemia. Cr and AG improved from yesterday.  Checked screening troponin which was negative, ECG.  No headache and  doubt intracranial etiology of emesis or meningitis.   Patient able to tolerate po gingerale without vomiting. Observed and given fluids over 8hr observation in ED, no emesis while in ED.  His vital signs have remained stable. Patient appropriate for continued outpatient management, po hydration. Given another rx for zofran. Suspect continued viral syndrome, versus continuing cannabinoid hyperemesis. Patient discharged in stable condition with understanding of reasons to return.    Final Clinical Impressions(s) / ED Diagnoses   Final diagnoses:  Nausea and vomiting, intractability of vomiting not specified, unspecified vomiting type  Influenza A  Dehydration    New Prescriptions Discharge Medication List as of 02/14/2017  2:56 PM    START taking these medications   Details  ondansetron (ZOFRAN ODT) 4 MG disintegrating tablet Take 1 tablet (4 mg total) by mouth every 8 (eight) hours as needed for nausea or vomiting., Starting Mon 02/14/2017, Print         Alvira Monday, MD 02/14/17 2142

## 2017-02-14 NOTE — Discharge Instructions (Signed)
Please read instructions below. Drink lots of fluids and continue taking your previously prescribed medications. Return for new or worsening symptoms.

## 2018-02-17 ENCOUNTER — Emergency Department (HOSPITAL_COMMUNITY): Payer: 59

## 2018-02-17 ENCOUNTER — Emergency Department (HOSPITAL_COMMUNITY)
Admission: EM | Admit: 2018-02-17 | Discharge: 2018-02-18 | Discharge status: Against Medical Advice | Payer: 59 | Attending: Physician Assistant | Admitting: Physician Assistant

## 2018-02-17 ENCOUNTER — Other Ambulatory Visit: Payer: Self-pay

## 2018-02-17 ENCOUNTER — Encounter (HOSPITAL_COMMUNITY): Payer: Self-pay | Admitting: Emergency Medicine

## 2018-02-17 DIAGNOSIS — Y999 Unspecified external cause status: Secondary | ICD-10-CM | POA: Diagnosis not present

## 2018-02-17 DIAGNOSIS — Z5321 Procedure and treatment not carried out due to patient leaving prior to being seen by health care provider: Secondary | ICD-10-CM | POA: Diagnosis not present

## 2018-02-17 DIAGNOSIS — M5442 Lumbago with sciatica, left side: Secondary | ICD-10-CM | POA: Insufficient documentation

## 2018-02-17 DIAGNOSIS — Y939 Activity, unspecified: Secondary | ICD-10-CM | POA: Diagnosis not present

## 2018-02-17 DIAGNOSIS — Y92410 Unspecified street and highway as the place of occurrence of the external cause: Secondary | ICD-10-CM | POA: Insufficient documentation

## 2018-02-17 DIAGNOSIS — M542 Cervicalgia: Secondary | ICD-10-CM | POA: Insufficient documentation

## 2018-02-17 MED ORDER — IBUPROFEN 400 MG PO TABS: 600.0000 mg | ORAL_TABLET | Freq: Once | ORAL | Status: DC
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
## 2018-04-05 ENCOUNTER — Encounter (HOSPITAL_COMMUNITY): Payer: Self-pay

## 2018-04-05 ENCOUNTER — Other Ambulatory Visit: Payer: Self-pay

## 2018-04-05 ENCOUNTER — Emergency Department (HOSPITAL_COMMUNITY)
Admission: EM | Admit: 2018-04-05 | Discharge: 2018-04-05 | Disposition: A | Discharge status: Home / Self Care | Payer: 59 | Attending: Emergency Medicine | Admitting: Emergency Medicine

## 2018-04-05 DIAGNOSIS — Z5321 Procedure and treatment not carried out due to patient leaving prior to being seen by health care provider: Secondary | ICD-10-CM | POA: Diagnosis not present

## 2018-04-05 DIAGNOSIS — R1110 Vomiting, unspecified: Secondary | ICD-10-CM | POA: Insufficient documentation

## 2018-04-05 LAB — CBC
HEMATOCRIT: 54.6 % — AB (ref 39.0–52.0)
HEMOGLOBIN: 19.3 g/dL — AB (ref 13.0–17.0)
MCH: 29.9 pg (ref 26.0–34.0)
MCHC: 35.3 g/dL (ref 30.0–36.0)
MCV: 84.5 fL (ref 78.0–100.0)
Platelets: 252 10*3/uL (ref 150–400)
RBC: 6.46 MIL/uL — AB (ref 4.22–5.81)
RDW: 12 % (ref 11.5–15.5)
WBC: 23.8 10*3/uL — AB (ref 4.0–10.5)

## 2018-04-05 LAB — COMPREHENSIVE METABOLIC PANEL
ALT: 14 U/L — ABNORMAL LOW (ref 17–63)
AST: 18 U/L (ref 15–41)
Albumin: 5.1 g/dL — ABNORMAL HIGH (ref 3.5–5.0)
Alkaline Phosphatase: 60 U/L (ref 38–126)
Anion gap: 17 — ABNORMAL HIGH (ref 5–15)
BUN: 31 mg/dL — ABNORMAL HIGH (ref 6–20)
CO2: 31 mmol/L (ref 22–32)
CREATININE: 1.78 mg/dL — AB (ref 0.61–1.24)
Calcium: 10 mg/dL (ref 8.9–10.3)
Chloride: 84 mmol/L — ABNORMAL LOW (ref 101–111)
GFR, EST NON AFRICAN AMERICAN: 53 mL/min — AB (ref >60)
Glucose, Bld: 115 mg/dL — ABNORMAL HIGH (ref 65–99)
POTASSIUM: 3.1 mmol/L — AB (ref 3.5–5.1)
SODIUM: 132 mmol/L — AB (ref 135–145)
Total Bilirubin: 1.1 mg/dL (ref 0.3–1.2)
Total Protein: 8.5 g/dL — ABNORMAL HIGH (ref 6.5–8.1)

## 2018-04-05 LAB — LIPASE, BLOOD: LIPASE: 20 U/L (ref 11–51)

## 2018-04-05 NOTE — ED Triage Notes (Signed)
Pt states that for the past for days he has been vomiting, generalized abd pain, feeling weak, constipated for the past four days. SOB and dry cough.

## 2018-04-05 NOTE — ED Notes (Signed)
Pt states that he is leaving due to wait time, unable to convince pt to stay.

## 2018-04-05 NOTE — ED Notes (Signed)
Pt. Left due to wait.  

## 2018-04-06 ENCOUNTER — Encounter (HOSPITAL_COMMUNITY): Payer: Self-pay | Admitting: Nurse Practitioner

## 2018-04-06 ENCOUNTER — Emergency Department (HOSPITAL_COMMUNITY)
Admission: EM | Admit: 2018-04-06 | Discharge: 2018-04-06 | Disposition: A | Discharge status: Home / Self Care | Payer: 59 | Attending: Emergency Medicine | Admitting: Emergency Medicine

## 2018-04-06 ENCOUNTER — Emergency Department (HOSPITAL_COMMUNITY): Payer: 59

## 2018-04-06 ENCOUNTER — Other Ambulatory Visit: Payer: Self-pay

## 2018-04-06 DIAGNOSIS — F17210 Nicotine dependence, cigarettes, uncomplicated: Secondary | ICD-10-CM | POA: Diagnosis not present

## 2018-04-06 DIAGNOSIS — J982 Interstitial emphysema: Secondary | ICD-10-CM | POA: Insufficient documentation

## 2018-04-06 DIAGNOSIS — R0602 Shortness of breath: Secondary | ICD-10-CM | POA: Diagnosis present

## 2018-04-06 LAB — CBC WITH DIFFERENTIAL/PLATELET
BASOS ABS: 0 10*3/uL (ref 0.0–0.1)
Basophils Relative: 0 %
EOS ABS: 0.2 10*3/uL (ref 0.0–0.7)
Eosinophils Relative: 1 %
HCT: 53.8 % — ABNORMAL HIGH (ref 39.0–52.0)
HEMOGLOBIN: 19.6 g/dL — AB (ref 13.0–17.0)
Lymphocytes Relative: 11 %
Lymphs Abs: 2.4 10*3/uL (ref 0.7–4.0)
MCH: 31.1 pg (ref 26.0–34.0)
MCHC: 36.4 g/dL — ABNORMAL HIGH (ref 30.0–36.0)
MCV: 85.3 fL (ref 78.0–100.0)
MONO ABS: 1.6 10*3/uL — AB (ref 0.1–1.0)
MONOS PCT: 7 %
NEUTROS ABS: 18.4 10*3/uL — AB (ref 1.7–7.7)
NEUTROS PCT: 81 %
PLATELETS: 215 10*3/uL (ref 150–400)
RBC: 6.31 MIL/uL — ABNORMAL HIGH (ref 4.22–5.81)
RDW: 12.4 % (ref 11.5–15.5)
WBC: 22.6 10*3/uL — ABNORMAL HIGH (ref 4.0–10.5)

## 2018-04-06 LAB — COMPREHENSIVE METABOLIC PANEL
ALK PHOS: 57 U/L (ref 38–126)
ALT: 15 U/L — AB (ref 17–63)
ANION GAP: 18 — AB (ref 5–15)
AST: 18 U/L (ref 15–41)
Albumin: 4.9 g/dL (ref 3.5–5.0)
BUN: 31 mg/dL — ABNORMAL HIGH (ref 6–20)
CALCIUM: 9.8 mg/dL (ref 8.9–10.3)
CO2: 28 mmol/L (ref 22–32)
CREATININE: 1.33 mg/dL — AB (ref 0.61–1.24)
Chloride: 85 mmol/L — ABNORMAL LOW (ref 101–111)
GFR calc non Af Amer: >60 mL/min (ref >60)
Glucose, Bld: 100 mg/dL — ABNORMAL HIGH (ref 65–99)
Potassium: 3.2 mmol/L — ABNORMAL LOW (ref 3.5–5.1)
Sodium: 131 mmol/L — ABNORMAL LOW (ref 135–145)
TOTAL PROTEIN: 8.6 g/dL — AB (ref 6.5–8.1)
Total Bilirubin: 1 mg/dL (ref 0.3–1.2)

## 2018-04-06 LAB — I-STAT CG4 LACTIC ACID, ED: Lactic Acid, Venous: 1.42 mmol/L (ref 0.5–1.9)

## 2018-04-06 MED ORDER — ALBUTEROL SULFATE HFA 108 (90 BASE) MCG/ACT IN AERS
1.0000 | INHALATION_SPRAY | Freq: Once | RESPIRATORY_TRACT | Status: AC
  Administered 2018-04-06: 1 via RESPIRATORY_TRACT
  Filled 2018-04-06: qty 6.7

## 2018-04-06 MED ORDER — IOHEXOL 300 MG/ML  SOLN
75.0000 mL | Freq: Once | INTRAMUSCULAR | Status: AC | PRN
  Administered 2018-04-06: 75 mL via INTRAVENOUS

## 2018-04-06 MED ORDER — FENTANYL CITRATE (PF) 100 MCG/2ML IJ SOLN
50.0000 ug | Freq: Once | INTRAMUSCULAR | Status: DC
  Filled 2018-04-06: qty 2

## 2018-04-06 MED ORDER — TRAMADOL HCL 50 MG PO TABS: 50.0000 mg | ORAL_TABLET | Freq: Three times a day (TID) | ORAL | 0 refills | Status: AC | PRN

## 2018-04-06 MED ORDER — VANCOMYCIN HCL 10 G IV SOLR
1500.0000 mg | Freq: Once | INTRAVENOUS | Status: AC
  Administered 2018-04-06: 1500 mg via INTRAVENOUS
  Filled 2018-04-06: qty 1500

## 2018-04-06 MED ORDER — SODIUM CHLORIDE 0.9 % IV BOLUS
1000.0000 mL | Freq: Once | INTRAVENOUS | Status: AC
  Administered 2018-04-06: 1000 mL via INTRAVENOUS

## 2018-04-06 MED ORDER — DIATRIZOATE MEGLUMINE & SODIUM 66-10 % PO SOLN: 120.0000 mL | Freq: Once | ORAL | Status: DC

## 2018-04-06 MED ORDER — ONDANSETRON 4 MG PO TBDP: 4.0000 mg | ORAL_TABLET | Freq: Three times a day (TID) | ORAL | 0 refills | Status: AC | PRN

## 2018-04-06 MED ORDER — PIPERACILLIN-TAZOBACTAM 3.375 G IVPB 30 MIN
3.3750 g | Freq: Once | INTRAVENOUS | Status: AC
  Administered 2018-04-06: 3.375 g via INTRAVENOUS
  Filled 2018-04-06: qty 50

## 2018-04-06 NOTE — ED Notes (Signed)
Pt was called x3 for vital reassess with no response

## 2018-04-06 NOTE — Discharge Instructions (Signed)
Return to the emergency department if you develop fevers within the next 72 hours, have difficulty breathing or worsening chest pain that is not controlled with prescribed medication.

## 2018-04-06 NOTE — ED Provider Notes (Signed)
[facility]-EMERGENCY DEPT Provider Note  CSN: [id number] Arrival date & time: 04/06/18 4098  Chief Complaint(s) No chief complaint on file.  HPI Blake Jennings is a 22 y.o. male with a history of asthma presents to the emergency department with several days of shortness of breath and chest pain.  Patient reports that 5 days ago he began having a headache.  Headache was persistent so he took 1 of his grandfathers pain medicine (either tramadol or hydrocodone).  Following this he became nauseated and began throwing out for several days with inability to tolerate oral hydration.  Ever since he started doing up, he reports that he is been having chest pain that has been persistent and constant since onset.  No alleviating or aggravating factors.  Has associated shortness of breath described as difficulty taking a big deep breath.  Denies any associated fevers.  endorses myalgias.  Denies any abdominal pain.  No diarrhea.  No sick contacts.  No suspicious food intake.    HPI  Past Medical History Past Medical History:  Diagnosis Date  . Asthma    There are no active problems to display for this patient.  Home Medication(s) Prior to Admission medications   Medication Sig Start Date End Date Taking? Authorizing Provider  acetaminophen (TYLENOL) 500 MG tablet Take 500 mg by mouth daily as needed for moderate pain.   Yes [provider]  HYDROCODONE-ACETAMINOPHEN PO Take 1 tablet by mouth daily as needed (pain).    Yes [provider]  ibuprofen (ADVIL,MOTRIN) 600 MG tablet Take 1 tablet (600 mg total) by mouth every 6 (six) hours as needed. Patient not taking: Reported on 04/06/2018 02/13/17   Jaynie Crumble, PA-C  ondansetron (ZOFRAN ODT) 4 MG disintegrating tablet Take 1 tablet (4 mg total) by mouth every 8 (eight) hours as needed for up to 3 days for nausea or vomiting. 04/06/18 04/09/18  Marlean Mortell, Amadeo Garnet, MD  promethazine (PHENERGAN) 25 MG  tablet Take 1 tablet (25 mg total) by mouth every 6 (six) hours as needed for nausea or vomiting. Patient not taking: Reported on 04/06/2018 07/21/15   Little, Ambrose Finland, MD  promethazine (PHENERGAN) 25 MG tablet Take 1 tablet (25 mg total) by mouth every 6 (six) hours as needed for nausea or vomiting. Patient not taking: Reported on 04/06/2018 02/13/17   Jaynie Crumble, PA-C  traMADol (ULTRAM) 50 MG tablet Take 1 tablet (50 mg total) by mouth every 8 (eight) hours as needed for up to 5 days. 04/06/18 04/11/18  Nira Conn, MD                                                                                                                                    Past Surgical History History reviewed. No pertinent surgical history. Family History History reviewed. No pertinent family history.  Social History Social History   Tobacco Use  . Smoking status: Current Every Day  Smoker    Types: Cigarettes  . Smokeless tobacco: Never Used  Substance Use Topics  . Alcohol use: Yes  . Drug use: Yes    Types: Marijuana   Allergies Patient has no known allergies.  Review of Systems Review of Systems All other systems are reviewed and are negative for acute change except as noted in the HPI  Physical Exam Vital Signs  I have reviewed the triage vital signs BP 130/90 (BP Location: Right Arm)   Pulse 84   Temp 99.4 F (37.4 C) (Rectal)   Resp 16   Ht 6' (1.829 m)   Wt 68 kg (150 lb)   SpO2 97%   BMI 20.34 kg/m   Physical Exam  Constitutional: He is oriented to person, place, and time. He appears well-developed and well-nourished. He appears ill. No distress.  HENT:  Head: Normocephalic and atraumatic.  Nose: Nose normal.  Eyes: Pupils are equal, round, and reactive to light. Conjunctivae and EOM are normal. Right eye exhibits no discharge. Left eye exhibits no discharge. No scleral icterus.  Neck: Normal range of motion. Neck supple.  Cardiovascular: Regular rhythm.  Tachycardia present. Exam reveals no gallop and no friction rub.  No murmur heard. Pulmonary/Chest: Effort normal and breath sounds normal. No stridor. No respiratory distress. He has no rales.  Abdominal: Soft. He exhibits no distension. There is no tenderness.  Musculoskeletal: He exhibits no edema or tenderness.  Neurological: He is alert and oriented to person, place, and time.  Skin: Skin is warm and dry. No rash noted. He is not diaphoretic. No erythema.  Flushed skin  Psychiatric: He has a normal mood and affect.  Vitals reviewed.   ED Results and Treatments Labs (all labs ordered are listed, but only abnormal results are displayed) Labs Reviewed  COMPREHENSIVE METABOLIC PANEL - Abnormal; Notable for the following components:      Result Value   Sodium 131 (*)    Potassium 3.2 (*)    Chloride 85 (*)    Glucose, Bld 100 (*)    BUN 31 (*)    Creatinine, Ser 1.33 (*)    Total Protein 8.6 (*)    ALT 15 (*)    Anion gap 18 (*)    All other components within normal limits  CBC WITH DIFFERENTIAL/PLATELET - Abnormal; Notable for the following components:   WBC 22.6 (*)    RBC 6.31 (*)    Hemoglobin 19.6 (*)    HCT 53.8 (*)    MCHC 36.4 (*)    Neutro Abs 18.4 (*)    Monocytes Absolute 1.6 (*)    All other components within normal limits  CULTURE, BLOOD (ROUTINE X 2)  CULTURE, BLOOD (ROUTINE X 2)  I-STAT CG4 LACTIC ACID, ED  TYPE AND SCREEN                                                                                                                         EKG  EKG  Interpretation  Date/Time:  Thursday Apr 06 2018 12:47:07 EDT Ventricular Rate:  89 PR Interval:    QRS Duration: 94 QT Interval:  345 QTC Calculation: 420 R Axis:   71 Text Interpretation:  Sinus rhythm LAE, consider biatrial enlargement Borderline ST elevation, anterior leads no acute changes from previous tracings Confirmed by Drema Pry [phone]) on 04/06/2018 1:53:19 PM      Radiology Dg Chest  2 View  Result Date: 04/06/2018 CLINICAL DATA:  Cough.  Vomiting. EXAM: CHEST - 2 VIEW COMPARISON:  02/13/2014 FINDINGS: Extensive pneumomediastinum tracking into the soft tissues of the supraclavicular fossa and neck. Barotrauma or esophageal perforation are both considered in the setting of vomiting. Lucency at the left apex is stable from prior; no visible pneumothorax. The lungs are clear and well aerated. Mild lumbar scoliosis. Normal heart size and aortic contours. These results were called by telephone at the time of interpretation on 04/06/2018 at 12:25 pm to Dr. Leary Roca , who verbally acknowledged these results. IMPRESSION: 1. Extensive pneumomediastinum with soft tissue emphysema in the neck and supraclavicular fossae, as above. 2. No visible pneumothorax or lung opacity. Electronically Signed   By: Marnee Spring M.D.   On: 04/06/2018 12:28   Ct Chest W Contrast  Result Date: 04/06/2018 CLINICAL DATA:  Chest pain and chills for 5 days. Cough and vomiting. Pneumomediastinum on chest radiograph. EXAM: CT CHEST WITH CONTRAST TECHNIQUE: Multidetector CT imaging of the chest was performed during intravenous contrast administration. Oral contrast was administered to opacify the esophagus. CONTRAST:  75mL OMNIPAQUE IOHEXOL 300 MG/ML  SOLN COMPARISON:  Chest radiograph on 04/06/2018 FINDINGS: Cardiovascular:  No acute findings. Mediastinum/Nodes: Moderate pneumomediastinum is seen with extensive chest wall subcutaneous emphysema in the upper thorax. Oral contrast opacifies the esophagus, and there is no evidence of esophageal contrast leak or extravasation. No masses or pathologically enlarged lymph nodes identified. Lungs/Pleura: No evidence of pneumothorax. No pulmonary infiltrate or mass identified. No effusion present. Upper Abdomen: Unremarkable. No free intraperitoneal air visualized. Musculoskeletal:  No suspicious bone lesions. IMPRESSION: Moderate pneumomediastinum and chest wall  subcutaneous emphysema. No evidence of esophageal perforation or other etiology visualized. No active lung disease.  No evidence of pneumothorax. Electronically Signed   By: Myles Rosenthal M.D.   On: 04/06/2018 14:49   Pertinent labs & imaging results that were available during my care of the patient were reviewed by me and considered in my medical decision making (see chart for details).  Medications Ordered in ED Medications  fentaNYL (SUBLIMAZE) injection 50 mcg (50 mcg Intravenous Refused 04/06/18 1506)  vancomycin (VANCOCIN) 1,500 mg in sodium chloride 0.9 % 500 mL IVPB (1,500 mg Intravenous New Bag/Given 04/06/18 1525)  diatrizoate meglumine-sodium (GASTROGRAFIN) 66-10 % solution 120 mL (has no administration in time range)  albuterol (PROVENTIL HFA;VENTOLIN HFA) 108 (90 Base) MCG/ACT inhaler 1 puff (has no administration in time range)  sodium chloride 0.9 % bolus 1,000 mL (1,000 mLs Intravenous New Bag/Given 04/06/18 1325)  sodium chloride 0.9 % bolus 1,000 mL (0 mLs Intravenous Stopped 04/06/18 1524)  piperacillin-tazobactam (ZOSYN) IVPB 3.375 g (0 g Intravenous Stopped 04/06/18 1510)  iohexol (OMNIPAQUE) 300 MG/ML solution 75 mL (75 mLs Intravenous Contrast Given 04/06/18 1400)  Procedures Procedures CRITICAL CARE Performed by: Amadeo Garnet Chealsey Miyamoto Total critical care time: 55 minutes Critical care time was exclusive of separately billable procedures and treating other patients. Critical care was necessary to treat or prevent imminent or life-threatening deterioration. Critical care was time spent personally by me on the following activities: development of treatment plan with patient and/or surrogate as well as nursing, discussions with consultants, evaluation of patient's response to treatment, examination of patient, obtaining history from patient or surrogate,  ordering and performing treatments and interventions, ordering and review of laboratory studies, ordering and review of radiographic studies, pulse oximetry and re-evaluation of patient's condition.   (including critical care time)  Medical Decision Making / ED Course I have reviewed the nursing notes for this encounter and the patient's prior records (if available in EHR or on provided paperwork).    Chest x-ray with large pneumomediastinum concerning for esophageal perforation.  Patient is afebrile, tachycardic and normotensive.  He is ill-appearing but nontoxic.  IV fluid bolus, pain medicine and empiric antibiotics were initiated early.  I discussed the case with Dr. Donata Clay who agreed with CT chest with oral and IV contrast.   CT did not reveal evidence of esophageal perforation.  Likely from a main  or proximal bronchus.  No evidence of infectious process.  Labs revealed evidence of dehydration and hemoconcentration.  They seem to have improved from yesterday's labs which she obtained at [facility] prior to leaving without being seen.  Lactic acid within normal limits.  After IV fluids, patient's tachycardia resolved.  I spoke with Dr. Donata Clay, who felt the patient would be stable for discharge home with close follow-up in his office in 4 days for repeat chest x-ray.  He recommended pain medicine, albuterol, rest. No need for antibiotics per Dr. Donata Clay as he is not septic.  Findings and CT surgery recommendations discussed with the patient and family.  They feel comfortable with the plan.  The patient appears reasonably screened and/or stabilized for discharge and I doubt any other medical condition or other [facility] requiring further screening, evaluation, or treatment in the ED at this time prior to discharge.  The patient is safe for discharge with strict return precautions.   Final Clinical Impression(s) / ED Diagnoses Final diagnoses:  Pneumomediastinum (HCC)    Disposition:  Discharge  Condition: Good  I have discussed the results, Dx and Tx plan with the patient and family who expressed understanding and agree(s) with the plan. Discharge instructions discussed at great length. The patient and family were given strict return precautions who verbalized understanding of the instructions. No further questions at time of discharge.    ED Discharge Orders        Ordered    traMADol (ULTRAM) 50 MG tablet  Every 8 hours PRN     04/06/18 1611    ondansetron (ZOFRAN ODT) 4 MG disintegrating tablet  Every 8 hours PRN     04/06/18 1611       Follow Up: Kerin Perna, MD [address] [place] [address] [place] [address] [phone]  Call on 04/10/2018 Call if you do not hear from them by tomorrow early afternoon to schedule follow-up appointment for upcoming Monday.     This chart was dictated using voice recognition software.  Despite best efforts to proofread,  errors can occur which can change the documentation meaning.   Nira Conn, MD 04/06/18 [phone]

## 2018-04-06 NOTE — ED Triage Notes (Signed)
Patient has been having body pains and achs and chills for 5 days. Patient states he cant eat or drink or use the bathroom. Patient has been coughing before the vomits.

## 2018-04-06 NOTE — ED Notes (Signed)
Per doctor order to stop antibiotics

## 2018-04-06 NOTE — Progress Notes (Signed)
A consult was received from an ED physician for vancomycin and zosyn per pharmacy dosing.  The patient's profile has been reviewed for ht/wt/allergies/indication/available labs.    A one time order has been placed for vancomycin 1500 mg IV and zosyn 3.375 gm IV over 30 min x1.  Further antibiotics/pharmacy consults should be ordered by admitting physician if indicated.                       Thank you, Lucia Gaskins 04/06/2018  1:26 PM

## 2018-04-07 ENCOUNTER — Other Ambulatory Visit: Payer: Self-pay | Admitting: Cardiothoracic Surgery

## 2018-04-07 DIAGNOSIS — J982 Interstitial emphysema: Secondary | ICD-10-CM

## 2018-04-10 ENCOUNTER — Institutional Professional Consult (permissible substitution) (INDEPENDENT_AMBULATORY_CARE_PROVIDER_SITE_OTHER): Payer: 59 | Admitting: Cardiothoracic Surgery

## 2018-04-10 ENCOUNTER — Ambulatory Visit
Admission: RE | Admit: 2018-04-10 | Discharge: 2018-04-10 | Disposition: A | Discharge status: Home / Self Care | Payer: 59 | Source: Ambulatory Visit | Attending: Cardiothoracic Surgery | Admitting: Cardiothoracic Surgery

## 2018-04-10 ENCOUNTER — Encounter: Payer: Self-pay | Admitting: Cardiothoracic Surgery

## 2018-04-10 ENCOUNTER — Other Ambulatory Visit: Payer: Self-pay

## 2018-04-10 VITALS — BP 120/83 | HR 69 | Resp 18 | Ht 70.0 in | Wt 152.8 lb

## 2018-04-10 DIAGNOSIS — J982 Interstitial emphysema: Secondary | ICD-10-CM

## 2018-04-10 NOTE — Progress Notes (Signed)
PCP is Patient, No Pcp Per Referring Provider is No ref. provider found  Chief Complaint  Patient presents with  . Referral    from the ED with [facility] 04/06/18 with CXR/CT CHEST...Marland KitchenMarland KitchenCXR TODAY   Patient examined, CT scan of chest images personally reviewed and discussed with patient. HPI: 22 year old male with history of asthma and mild smoking history presents for ED follow-up with recent diagnosis of pneumomediastinum.  Patient presented to the ED on May 16 with chest discomfort after having episode of vomiting and dry heaves.  Patient had a heart rate of 84 and had a leukocytosis.  CT scan of the chest showed subcutaneous and mediastinal air- pneumomediastinum.  There is no evidence of leak from the esophagus with oral contrast.  There is no evidence of pneumothorax.  There is no evidence of parenchymal lung disease of cyst or bullous changes.  The patient was treated with mild pain medication- tramadol and discharged home for reduced level of activity and rest.  He returns today for follow-up.  Patient does have history of asthma but does not take any chronic inhalers.  Since being seen in the ED the patient has progressively improved.  His chest pain is almost gone.  He does have some sensation of resistance to taking a deep breath.  No fever.  No more vomiting. Chest x-ray done today shows   Minimal subcutaneous air.  Lungs are clear.  No pleural effusion.  Past Medical History:  Diagnosis Date  . Asthma     History reviewed. No pertinent surgical history.  History reviewed. No pertinent family history.  Social History Social History   Tobacco Use  . Smoking status: Current Every Day Smoker    Types: Cigarettes  . Smokeless tobacco: Never Used  . Tobacco comment: pt states 2 daily  Substance Use Topics  . Alcohol use: Yes  . Drug use: Yes    Types: Marijuana    Current Outpatient Medications  Medication Sig Dispense Refill  . acetaminophen (TYLENOL) 500 MG  tablet Take 500 mg by mouth daily as needed for moderate pain.    Marland Kitchen ibuprofen (ADVIL,MOTRIN) 600 MG tablet Take 1 tablet (600 mg total) by mouth every 6 (six) hours as needed. 30 tablet 0  . promethazine (PHENERGAN) 25 MG tablet Take 1 tablet (25 mg total) by mouth every 6 (six) hours as needed for nausea or vomiting. 8 tablet 0  . traMADol (ULTRAM) 50 MG tablet Take 1 tablet (50 mg total) by mouth every 8 (eight) hours as needed for up to 5 days. 15 tablet 0   No current facility-administered medications for this visit.     No Known Allergies  Review of Systems  No fever Weight stable No difficulty swallowing No headache or change in vision No abdominal pain or change in bowel habits No productive cough or hemoptysis No rash or jaundice  BP 120/83 (BP Location: Right Arm, Patient Position: Sitting, Cuff Size: Normal)   Pulse 69   Resp 18   Ht  (1.778 m)   Wt 152 lb 12.8 oz (69.3 kg)   SpO2 100% Comment: RA  BMI 21.92 kg/m  Physical Exam      Exam    General- alert and comfortable    Neck- no JVD, no cervical adenopathy palpable, no carotid bruit   Lungs- clear without rales, wheezes   Cor- regular rate and rhythm, no murmur , gallop   Abdomen- soft, non-tender   Extremities - warm, non-tender, minimal edema  Neuro- oriented, appropriate, no focal weakness   Diagnostic Tests: Chest x-ray performed today personally reviewed showing almost total resolution of the pneumomediastinum and subcu anus emphysema  Impression: Resolution of spontaneous pneumomediastinum  Plan: Patient can resume normal activities on June 1.  Until then he should just do routine daily activities, no heavy lifting or working out or heavy exertion.  Return as needed.   Mikey Bussing, MD [facility] [phone]

## 2018-04-11 LAB — CULTURE, BLOOD (ROUTINE X 2)
CULTURE: NO GROWTH
Culture: NO GROWTH
Special Requests: ADEQUATE
Special Requests: ADEQUATE

## 2019-02-27 ENCOUNTER — Other Ambulatory Visit: Payer: Self-pay

## 2019-02-27 ENCOUNTER — Inpatient Hospital Stay
Admission: EM | Admit: 2019-02-27 | Discharge: 2019-03-01 | DRG: 566 | Payer: 59 | Attending: Internal Medicine | Admitting: Internal Medicine

## 2019-02-27 ENCOUNTER — Encounter: Payer: Self-pay | Admitting: Emergency Medicine

## 2019-02-27 DIAGNOSIS — R740 Nonspecific elevation of levels of transaminase and lactic acid dehydrogenase [LDH]: Secondary | ICD-10-CM | POA: Diagnosis present

## 2019-02-27 DIAGNOSIS — M545 Low back pain: Secondary | ICD-10-CM | POA: Diagnosis present

## 2019-02-27 DIAGNOSIS — K219 Gastro-esophageal reflux disease without esophagitis: Secondary | ICD-10-CM | POA: Diagnosis present

## 2019-02-27 DIAGNOSIS — T796XXA Traumatic ischemia of muscle, initial encounter: Secondary | ICD-10-CM | POA: Diagnosis not present

## 2019-02-27 DIAGNOSIS — M6282 Rhabdomyolysis: Secondary | ICD-10-CM | POA: Diagnosis present

## 2019-02-27 DIAGNOSIS — J45909 Unspecified asthma, uncomplicated: Secondary | ICD-10-CM | POA: Diagnosis present

## 2019-02-27 DIAGNOSIS — F17210 Nicotine dependence, cigarettes, uncomplicated: Secondary | ICD-10-CM | POA: Diagnosis present

## 2019-02-27 DIAGNOSIS — X58XXXA Exposure to other specified factors, initial encounter: Secondary | ICD-10-CM | POA: Diagnosis present

## 2019-02-27 LAB — CBC WITH DIFFERENTIAL/PLATELET
Abs Immature Granulocytes: 0.03 10*3/uL (ref 0.00–0.07)
Basophils Absolute: 0.1 10*3/uL (ref 0.0–0.1)
Basophils Relative: 1 %
Eosinophils Absolute: 0.2 10*3/uL (ref 0.0–0.5)
Eosinophils Relative: 2 %
HCT: 46.9 % (ref 39.0–52.0)
Hemoglobin: 16.1 g/dL (ref 13.0–17.0)
Immature Granulocytes: 0 %
Lymphocytes Relative: 17 %
Lymphs Abs: 1.6 10*3/uL (ref 0.7–4.0)
MCH: 29.7 pg (ref 26.0–34.0)
MCHC: 34.3 g/dL (ref 30.0–36.0)
MCV: 86.4 fL (ref 80.0–100.0)
Monocytes Absolute: 0.6 10*3/uL (ref 0.1–1.0)
Monocytes Relative: 7 %
Neutro Abs: 6.8 10*3/uL (ref 1.7–7.7)
Neutrophils Relative %: 73 %
Platelets: 205 10*3/uL (ref 150–400)
RBC: 5.43 MIL/uL (ref 4.22–5.81)
RDW: 11.9 % (ref 11.5–15.5)
WBC: 9.3 10*3/uL (ref 4.0–10.5)
nRBC: 0 % (ref 0.0–0.2)

## 2019-02-27 LAB — MRSA PCR SCREENING: MRSA by PCR: NEGATIVE

## 2019-02-27 LAB — COMPREHENSIVE METABOLIC PANEL
ALT: 645 U/L — ABNORMAL HIGH (ref 0–44)
AST: 2652 U/L — ABNORMAL HIGH (ref 15–41)
Albumin: 4.4 g/dL (ref 3.5–5.0)
Alkaline Phosphatase: 46 U/L (ref 38–126)
Anion gap: 11 (ref 5–15)
BUN: 15 mg/dL (ref 6–20)
CO2: 29 mmol/L (ref 22–32)
Calcium: 9.5 mg/dL (ref 8.9–10.3)
Chloride: 99 mmol/L (ref 98–111)
Creatinine, Ser: 1.05 mg/dL (ref 0.61–1.24)
GFR calc Af Amer: >60 mL/min (ref >60)
GFR calc non Af Amer: >60 mL/min (ref >60)
Glucose, Bld: 93 mg/dL (ref 70–99)
Potassium: 3.8 mmol/L (ref 3.5–5.1)
Sodium: 139 mmol/L (ref 135–145)
Total Bilirubin: 1.2 mg/dL (ref 0.3–1.2)
Total Protein: 7.6 g/dL (ref 6.5–8.1)

## 2019-02-27 LAB — PROTIME-INR
INR: 1 (ref 0.8–1.2)
Prothrombin Time: 13 seconds (ref 11.4–15.2)

## 2019-02-27 LAB — TROPONIN I: Troponin I: <0.03 ng/mL (ref <0.03)

## 2019-02-27 LAB — CK: Total CK: >50000 U/L — ABNORMAL HIGH (ref 49–397)

## 2019-02-27 MED ORDER — ONDANSETRON HCL 4 MG PO TABS: 4.0000 mg | ORAL_TABLET | Freq: Four times a day (QID) | ORAL | Status: DC | PRN

## 2019-02-27 MED ORDER — ENOXAPARIN SODIUM 40 MG/0.4ML ~~LOC~~ SOLN
40.0000 mg | SUBCUTANEOUS | Status: DC
  Administered 2019-02-27: 40 mg via SUBCUTANEOUS
  Filled 2019-02-27: qty 0.4

## 2019-02-27 MED ORDER — PROMETHAZINE HCL 25 MG PO TABS
25.0000 mg | ORAL_TABLET | Freq: Four times a day (QID) | ORAL | Status: DC | PRN
Start: 2019-02-27 — End: 2019-03-01

## 2019-02-27 MED ORDER — ACETAMINOPHEN 500 MG PO TABS
500.0000 mg | ORAL_TABLET | Freq: Every day | ORAL | Status: DC | PRN
  Administered 2019-02-27: 500 mg via ORAL
  Filled 2019-02-27: qty 1

## 2019-02-27 MED ORDER — ONDANSETRON HCL 4 MG/2ML IJ SOLN
4.0000 mg | Freq: Four times a day (QID) | INTRAMUSCULAR | Status: DC | PRN
Start: 2019-02-27 — End: 2019-03-01

## 2019-02-27 MED ORDER — SODIUM CHLORIDE 0.9 % IV SOLN
Freq: Once | INTRAVENOUS | Status: AC
  Administered 2019-02-27: 15:00:00 via INTRAVENOUS

## 2019-02-27 MED ORDER — FAMOTIDINE 20 MG PO TABS
20.0000 mg | ORAL_TABLET | Freq: Every day | ORAL | Status: DC
  Administered 2019-02-27 – 2019-03-01 (×3): 20 mg via ORAL
  Filled 2019-02-27 (×3): qty 1

## 2019-02-27 MED ORDER — SODIUM CHLORIDE 0.9 % IV SOLN
INTRAVENOUS | Status: DC
  Administered 2019-02-27 – 2019-03-01 (×6): via INTRAVENOUS

## 2019-02-27 NOTE — ED Notes (Signed)
ED TO INPATIENT HANDOFF REPORT  ED Nurse Name and Phone #:  [phone] S Name/Age/Gender Blake Jennings 23 y.o. male Room/Bed: ED03A/ED03A  Code Status   Code Status: Not on file  Home/SNF/Other Prison Patient oriented to: self, place, time and situation Is this baseline? Yes   Triage Complete: Triage complete  Chief Complaint abnormal labs  Triage Note CK > 50000, AST 2412, ALT 533.  [facility].  Pt reports had pain in thighs after squats last Friday as well as hematuria.  Ambulatory. NAD   Allergies No Known Allergies  Level of Care/Admitting Diagnosis ED Disposition    ED Disposition Condition Comment   Admit  Hospital Area: [facility] [[id number]]  Level of Care: Med-Surg [16]  Diagnosis: Rhabdomyolysis [728.88.ICD-9-CM]  Admitting Physician: Ramonita Lab [[id number]]  Attending Physician: Ramonita Lab [[id number]]  Estimated length of stay: 3 - 4 days  Certification:: I certify this patient will need inpatient services for at least 2 midnights  PT Class (Do Not Modify): Inpatient [101]  PT Acc Code (Do Not Modify): Private [1]       B Medical/Surgery History Past Medical History:  Diagnosis Date  . Asthma    History reviewed. No pertinent surgical history.   A IV Location/Drains/Wounds Patient Lines/Drains/Airways Status   Active Line/Drains/Airways    Name:   Placement date:   Placement time:   Site:   Days:   Peripheral IV 02/27/19 Right;Anterior;Lateral Antecubital   02/27/19    1430    Antecubital   less than 1          Intake/Output Last 24 hours  Intake/Output Summary (Last 24 hours) at 02/27/2019 1659 Last data filed at 02/27/2019 1658 Gross per 24 hour  Intake 2000 ml  Output -  Net 2000 ml    Labs/Imaging Results for orders placed or performed during the hospital encounter of 02/27/19 (from the past 48 hour(s))  CBC with Differential/Platelet     Status: None   Collection Time: 02/27/19  2:32 PM   Result Value Ref Range   WBC 9.3 4.0 - 10.5 K/uL   RBC 5.43 4.22 - 5.81 MIL/uL   Hemoglobin 16.1 13.0 - 17.0 g/dL   HCT 54.0 98.1 - 19.1 %   MCV 86.4 80.0 - 100.0 fL   MCH 29.7 26.0 - 34.0 pg   MCHC 34.3 30.0 - 36.0 g/dL   RDW 47.8 29.5 - 62.1 %   Platelets 205 150 - 400 K/uL   nRBC 0.0 0.0 - 0.2 %   Neutrophils Relative % 73 %   Neutro Abs 6.8 1.7 - 7.7 K/uL   Lymphocytes Relative 17 %   Lymphs Abs 1.6 0.7 - 4.0 K/uL   Monocytes Relative 7 %   Monocytes Absolute 0.6 0.1 - 1.0 K/uL   Eosinophils Relative 2 %   Eosinophils Absolute 0.2 0.0 - 0.5 K/uL   Basophils Relative 1 %   Basophils Absolute 0.1 0.0 - 0.1 K/uL   Immature Granulocytes 0 %   Abs Immature Granulocytes 0.03 0.00 - 0.07 K/uL    Comment: Performed at [facility], [address] [place], [place] [address]  Comprehensive metabolic panel     Status: Abnormal (Preliminary result)   Collection Time: 02/27/19  2:32 PM  Result Value Ref Range   Sodium 139 135 - 145 mmol/L   Potassium 3.8 3.5 - 5.1 mmol/L   Chloride 99 98 - 111 mmol/L   CO2 29 22 -  32 mmol/L   Glucose, Bld 93 70 - 99 mg/dL   BUN 15 6 - 20 mg/dL   Creatinine, Ser 0.07 0.61 - 1.24 mg/dL   Calcium 9.5 8.9 - 62.2 mg/dL   Total Protein 7.6 6.5 - 8.1 g/dL   Albumin 4.4 3.5 - 5.0 g/dL   AST PENDING 15 - 41 U/L   ALT 645 (H) 0 - 44 U/L   Alkaline Phosphatase 46 38 - 126 U/L   Total Bilirubin 1.2 0.3 - 1.2 mg/dL   GFR calc non Af Amer >60 >60 mL/min   GFR calc Af Amer >60 >60 mL/min   Anion gap 11 5 - 15    Comment: Performed at [facility], [address] [place], [place] [address]  Troponin I - ONCE - STAT     Status: None   Collection Time: 02/27/19  2:32 PM  Result Value Ref Range   Troponin I <0.03 <0.03 ng/mL    Comment: Performed at [facility], [address] [place], [place] [address]  Protime-INR     Status: None   Collection Time: 02/27/19  2:32 PM  Result Value Ref Range   Prothrombin Time 13.0 11.4 -  15.2 seconds   INR 1.0 0.8 - 1.2    Comment: (NOTE) INR goal varies based on device and disease states. Performed at [facility], [address] [place], [place] [address]    No results found.  Pending Labs Unresulted Labs (From admission, onward)    Start     Ordered   02/27/19 1421  CK  ONCE - STAT,   STAT     02/27/19 1420   Signed and Held  HIV antibody (Routine Testing)  Once,   R     Signed and Held   Signed and Held  CBC  (enoxaparin (LOVENOX)    CrCl >/= 30 ml/min)  Once,   R    Comments:  Baseline for enoxaparin therapy IF NOT ALREADY DRAWN.  Notify MD if PLT < 100 K.    Signed and Held   Signed and Held  Creatinine, serum  (enoxaparin (LOVENOX)    CrCl >/= 30 ml/min)  Once,   R    Comments:  Baseline for enoxaparin therapy IF NOT ALREADY DRAWN.    Signed and Held   Signed and Held  Creatinine, serum  (enoxaparin (LOVENOX)    CrCl >/= 30 ml/min)  Weekly,   R    Comments:  while on enoxaparin therapy    Signed and Held   Signed and Held  Comprehensive metabolic panel  Tomorrow morning,   R     Signed and Held   Signed and Held  CBC  Tomorrow morning,   R     Signed and Held          Vitals/Pain Today's Vitals   02/27/19 1530 02/27/19 1600 02/27/19 1630 02/27/19 1657  BP: 131/79 136/79 130/78   Pulse: 87 90 79   Resp: 19 (!) 21 20   Temp:      TempSrc:      SpO2: 100% 100% 100%   Weight:      Height:      PainSc:    0-No pain    Isolation Precautions No active isolations  Medications Medications  famotidine (PEPCID) tablet 20 mg (has no administration in time range)  0.9 %  sodium chloride infusion ( Intravenous Stopped 02/27/19 1658)  0.9 %  sodium chloride infusion (  Intravenous Stopped 02/27/19 1658)    Mobility walks Low fall risk   Focused Assessments Neuro Assessment Handoff:  Swallow screen pass? Yes          Neuro Assessment:   Neuro Checks:      Last Documented NIHSS Modified Score:   Has TPA been given? No If  patient is a Neuro Trauma and patient is going to OR before floor call report to 4N Charge nurse: [phone] or [phone]     R Recommendations: See Admitting Provider Note  Report given to: Josh  Additional Notes: \

## 2019-02-27 NOTE — ED Provider Notes (Signed)
[facility] Emergency Department Provider Note       Time seen: ----------------------------------------- 2:18 PM on 02/27/2019 -----------------------------------------   I have reviewed the triage vital signs and the nursing notes.  HISTORY   Chief Complaint Abnormal Lab    HPI Blake Jennings is a 23 y.o. male with a history of asthma who presents to the ED for possible rhabdomyolysis.  As an outpatient he had CK levels obtained yesterday that were over 50,000.  His AST and ALT were also elevated.  He states he was doing squats on Friday, currently incarcerated in prison.  He was having lower back as well as thigh pain with some dark looking urine.  He reports he still making urine.  Past Medical History:  Diagnosis Date  . Asthma     There are no active problems to display for this patient.   History reviewed. No pertinent surgical history.  Allergies Patient has no known allergies.  Social History Social History   Tobacco Use  . Smoking status: Current Every Day Smoker    Types: Cigarettes  . Smokeless tobacco: Never Used  . Tobacco comment: pt states 2 daily  Substance Use Topics  . Alcohol use: Yes  . Drug use: Yes    Types: Marijuana   Review of Systems Constitutional: Negative for fever. Cardiovascular: Negative for chest pain. Respiratory: Negative for shortness of breath. Gastrointestinal: Negative for abdominal pain, vomiting and diarrhea. Genitourinary: Positive for urine discoloration Musculoskeletal: Positive for leg pain and low back pain Skin: Negative for rash. Neurological: Negative for headaches, focal weakness or numbness.  All systems negative/normal/unremarkable except as stated in the HPI  ____________________________________________   PHYSICAL EXAM:  VITAL SIGNS: ED Triage Vitals  Enc Vitals Group     BP --      Pulse --      Resp --      Temp --      Temp src --      SpO2 --      Weight  02/27/19 1415 150 lb (68 kg)     Height 02/27/19 1415 5\' 10"  (1.778 m)     Head Circumference --      Peak Flow --      Pain Score 02/27/19 1414 0     Pain Loc --      Pain Edu? --      Excl. in GC? --    Constitutional: Alert and oriented. Well appearing and in no distress. Eyes: Conjunctivae are normal. Normal extraocular movements. ENT      Head: Normocephalic and atraumatic.      Nose: No congestion/rhinnorhea.      Mouth/Throat: Mucous membranes are moist.      Neck: No stridor. Cardiovascular: Normal rate, regular rhythm. No murmurs, rubs, or gallops. Respiratory: Normal respiratory effort without tachypnea nor retractions. Breath sounds are clear and equal bilaterally. No wheezes/rales/rhonchi. Gastrointestinal: Soft and nontender. Normal bowel sounds Musculoskeletal: Mild thigh tenderness and swelling is noted Neurologic:  Normal speech and language. No gross focal neurologic deficits are appreciated.  Skin:  Skin is warm, dry and intact. No rash noted. Psychiatric: Mood and affect are normal. Speech and behavior are normal.   ____________________________________________  ED COURSE:  As part of my medical decision making, I reviewed the following data within the electronic MEDICAL RECORD NUMBER History obtained from family if available, nursing notes, old chart and ekg, as well as notes from prior ED visits. Patient presented for possible rhabdomyolysis, we will  assess with labs as indicated at this time   Procedures  Blake Jennings was evaluated in Emergency Department on 02/27/2019 for the symptoms described in the history of present illness. He was evaluated in the context of the global COVID-19 pandemic, which necessitated consideration that the patient might be at risk for infection with the SARS-CoV-2 virus that causes COVID-19. Institutional protocols and algorithms that pertain to the evaluation of patients at risk for COVID-19 are in a state of rapid change based on  information released by regulatory bodies including the CDC and federal and state organizations. These policies and algorithms were followed during the patient's care in the ED.  ____________________________________________   LABS (pertinent positives/negatives)  Labs Reviewed  CBC WITH DIFFERENTIAL/PLATELET  PROTIME-INR  COMPREHENSIVE METABOLIC PANEL  CK  TROPONIN I  ____________________________________________   DIFFERENTIAL DIAGNOSIS   Rhabdomyolysis, dehydration, electrolyte abnormality, renal failure, hepatitis  FINAL ASSESSMENT AND PLAN  Rhabdomyolysis   Plan: The patient had presented for elevated CK level obtained as an outpatient. Patient's labs do reveal rhabdomyolysis.  Outpatient labs are concerning for a markedly elevated CK level with some elevated liver transaminases.  We have started on IV fluids here.  I will discuss with the hospitalist for admission.Ulice Dash.    Blake E Williams, MD    Note: This note was generated in part or whole with voice recognition software. Voice recognition is usually quite accurate but there are transcription errors that can and very often do occur. I apologize for any typographical errors that were not detected and corrected.     Blake Jennings, Jonathan E, MD 02/27/19 [phone]

## 2019-02-27 NOTE — ED Triage Notes (Signed)
CK > 50000, AST 2412, ALT 533.  Pt sent from jail for abnormal labs.  Pt reports had pain in thighs after squats last Friday as well as hematuria.  Ambulatory. NAD

## 2019-02-27 NOTE — H&P (Signed)
[facility] [place] at [facility]   PATIENT NAME: Blake Jennings    MR#:  [id number]  DATE OF BIRTH:  1996/10/02  DATE OF ADMISSION:  02/27/2019  PRIMARY CARE PHYSICIAN: Patient, No Pcp Per   REQUESTING/REFERRING PHYSICIAN: Daryel NovemberJonathan Williams  CHIEF COMPLAINT:   Abnormal lab HISTORY OF PRESENT ILLNESS:  Blake Jennings  is a 23 y.o. male with a known history of asthma is brought into the ED with abnormal lab with CK level yesterday at 50,000.  Patient is brought in from prison as he is incarcerated.  Patient admits that he was doing squats on Friday vigorously.  Reporting dark urine and low back pain CK ordered in the ED still pending.  AST and ALT are elevated.  PAST MEDICAL HISTORY:   Past Medical History:  Diagnosis Date  . Asthma     PAST SURGICAL HISTOIRY:  History reviewed. No pertinent surgical history.  SOCIAL HISTORY:   Social History   Tobacco Use  . Smoking status: Current Every Day Smoker    Types: Cigarettes  . Smokeless tobacco: Never Used  . Tobacco comment: pt states 2 daily  Substance Use Topics  . Alcohol use: Yes    FAMILY HISTORY:  History reviewed. No pertinent family history.  DRUG ALLERGIES:  No Known Allergies  REVIEW OF SYSTEMS:  CONSTITUTIONAL: No fever, fatigue or weakness.  EYES: No blurred or double vision.  EARS, NOSE, AND THROAT: No tinnitus or ear pain.  RESPIRATORY: No cough, shortness of breath, wheezing or hemoptysis.  CARDIOVASCULAR: No chest pain, orthopnea, edema.  GASTROINTESTINAL: No nausea, vomiting, diarrhea or abdominal pain.  GENITOURINARY: No dysuria, hematuria.  ENDOCRINE: No polyuria, nocturia,  HEMATOLOGY: No anemia, easy bruising or bleeding SKIN: No rash or lesion. MUSCULOSKELETAL: No joint pain or arthritis.  Reports low back pain NEUROLOGIC: No tingling, numbness, weakness.  PSYCHIATRY: No anxiety or depression.   MEDICATIONS AT HOME:   Prior to Admission medications    Medication Sig Start Date End Date Taking? Authorizing Provider  acetaminophen (TYLENOL) 500 MG tablet Take 500 mg by mouth daily as needed for moderate pain.    [provider]  ibuprofen (ADVIL,MOTRIN) 600 MG tablet Take 1 tablet (600 mg total) by mouth every 6 (six) hours as needed. 02/13/17   Kirichenko, Lemont Fillersatyana, PA-C  promethazine (PHENERGAN) 25 MG tablet Take 1 tablet (25 mg total) by mouth every 6 (six) hours as needed for nausea or vomiting. 07/21/15   Little, Ambrose Finlandachel Morgan, MD      VITAL SIGNS:  Blood pressure 131/79, pulse 87, temperature 98.6 F (37 C), temperature source Oral, resp. rate 19, height 5\' 10"  (1.778 m), weight 68 kg, SpO2 100 %.  PHYSICAL EXAMINATION:  GENERAL:  23 y.o.-year-old patient lying in the bed with no acute distress.  EYES: Pupils equal, round, reactive to light and accommodation. No scleral icterus. Extraocular muscles intact.  HEENT: Head atraumatic, normocephalic. Oropharynx and nasopharynx clear.  NECK:  Supple, no jugular venous distention. No thyroid enlargement, no tenderness.  LUNGS: Normal breath sounds bilaterally, no wheezing, rales,rhonchi or crepitation. No use of accessory muscles of respiration.  CARDIOVASCULAR: S1, S2 normal. No murmurs, rubs, or gallops.  ABDOMEN: Soft, nontender, nondistended. Bowel sounds present.  EXTREMITIES: No pedal edema, cyanosis, or clubbing.  NEUROLOGIC: Awake, alert and oriented x3 sensation intact. Gait not checked.  PSYCHIATRIC: The patient is alert and oriented x 3.  SKIN: No obvious rash, lesion, or ulcer.   LABORATORY PANEL:   CBC Recent Labs  Lab 02/27/19 1432  WBC 9.3  HGB 16.1  HCT 46.9  PLT 205   ------------------------------------------------------------------------------------------------------------------  Chemistries  Recent Labs  Lab 02/27/19 1432  NA 139  K 3.8  CL 99  CO2 29  GLUCOSE 93  BUN 15  CREATININE 1.05  CALCIUM 9.5  AST PENDING  ALT 645*  ALKPHOS 46   BILITOT 1.2   ------------------------------------------------------------------------------------------------------------------  Cardiac Enzymes Recent Labs  Lab 02/27/19 1432  TROPONINI <0.03   ------------------------------------------------------------------------------------------------------------------  RADIOLOGY:  No results found.  EKG:   Orders placed or performed during the hospital encounter of 04/06/18  . EKG 12-Lead  . EKG 12-Lead  . ED EKG 12-Lead  . ED EKG 12-Lead  . EKG    IMPRESSION AND PLAN:     #Acute rhabdomyolysis Admit to MedSurg unit Aggressive hydration with IV fluids.  Will consider adding sodium bicarb CK level from our hospital he still pending Serial CKs   #Asthma No exacerbation at this time Bronchodilator treatments as needed  #Elevated LFTs-transaminitis Patient is asymptomatic we will continue close monitoring avoid hepatotoxic meds  #GERD-Pepcid  #Low back pain Tylenol as needed  All the records are reviewed and case discussed with ED provider. Management plans discussed with the patient, family and they are in agreement.  CODE STATUS: fc   TOTAL TIME TAKING CARE OF THIS PATIENT: 42 minutes.   Note: This dictation was prepared with Dragon dictation along with smaller phrase technology. Any transcriptional errors that result from this process are unintentional.  Ramonita Lab M.D on 02/27/2019 at 4:33 PM  Between 7am to 6pm - Pager - [phone]  After 6pm go to [email or web address] - password [email or web address]  [facility]  Office  [phone]  CC: Primary care physician; Patient, No Pcp Per

## 2019-02-28 LAB — COMPREHENSIVE METABOLIC PANEL
ALT: 465 U/L — ABNORMAL HIGH (ref 0–44)
AST: 1558 U/L — ABNORMAL HIGH (ref 15–41)
Albumin: 3.4 g/dL — ABNORMAL LOW (ref 3.5–5.0)
Alkaline Phosphatase: 39 U/L (ref 38–126)
Anion gap: 6 (ref 5–15)
BUN: 13 mg/dL (ref 6–20)
CO2: 24 mmol/L (ref 22–32)
Calcium: 8.6 mg/dL — ABNORMAL LOW (ref 8.9–10.3)
Chloride: 108 mmol/L (ref 98–111)
Creatinine, Ser: 0.85 mg/dL (ref 0.61–1.24)
GFR calc Af Amer: >60 mL/min (ref >60)
GFR calc non Af Amer: >60 mL/min (ref >60)
Glucose, Bld: 85 mg/dL (ref 70–99)
Potassium: 4 mmol/L (ref 3.5–5.1)
Sodium: 138 mmol/L (ref 135–145)
Total Bilirubin: 0.7 mg/dL (ref 0.3–1.2)
Total Protein: 5.9 g/dL — ABNORMAL LOW (ref 6.5–8.1)

## 2019-02-28 LAB — CBC
HCT: 39.5 % (ref 39.0–52.0)
Hemoglobin: 13.5 g/dL (ref 13.0–17.0)
MCH: 30.1 pg (ref 26.0–34.0)
MCHC: 34.2 g/dL (ref 30.0–36.0)
MCV: 88 fL (ref 80.0–100.0)
Platelets: 176 10*3/uL (ref 150–400)
RBC: 4.49 MIL/uL (ref 4.22–5.81)
RDW: 11.8 % (ref 11.5–15.5)
WBC: 10.1 10*3/uL (ref 4.0–10.5)
nRBC: 0 % (ref 0.0–0.2)

## 2019-02-28 LAB — CK: Total CK: 94152 U/L — ABNORMAL HIGH (ref 49–397)

## 2019-02-28 NOTE — Progress Notes (Signed)
I paged MD on call and Dr Anne Hahn returned my page about pt's CK lab result of >50,000. He said continue to monitor and he put in an order to have lab repeated in the am.

## 2019-02-28 NOTE — Progress Notes (Signed)
MD notified: CK level has just came back it is 94,152. Yesterday it was 50,000.

## 2019-02-28 NOTE — Progress Notes (Signed)
[facility] - [place] at [facility]   PATIENT NAME: Blake Jennings    MR#:  [id number]  DATE OF BIRTH:  03/21/1996  SUBJECTIVE:  CHIEF COMPLAINT:   Chief Complaint  Patient presents with  . Abnormal Lab   -Elevated CPK and transaminases.  Admitted with rhabdomyolysis -Clinically some improvement noted   REVIEW OF SYSTEMS:  Review of Systems  Constitutional: Negative for chills, fever and malaise/fatigue.  HENT: Negative for hearing loss.   Eyes: Negative for blurred vision and double vision.  Respiratory: Negative for cough, shortness of breath and wheezing.   Cardiovascular: Negative for chest pain and palpitations.  Gastrointestinal: Negative for abdominal pain, constipation, diarrhea, nausea and vomiting.  Genitourinary: Negative for dysuria.  Musculoskeletal: Positive for myalgias.  Neurological: Negative for dizziness, focal weakness, seizures, weakness and headaches.  Psychiatric/Behavioral: Negative for depression.    DRUG ALLERGIES:  No Known Allergies  VITALS:  Blood pressure 114/65, pulse 68, temperature (!) 97.4 F (36.3 C), temperature source Oral, resp. rate 20, height 5\' 10"  (1.778 m), weight 68 kg, SpO2 99 %.  PHYSICAL EXAMINATION:  Physical Exam   GENERAL:  23 y.o.-year-old patient lying in the bed with no acute distress.  EYES: Pupils equal, round, reactive to light and accommodation. No scleral icterus. Extraocular muscles intact.  HEENT: Head atraumatic, normocephalic. Oropharynx and nasopharynx clear.  NECK:  Supple, no jugular venous distention. No thyroid enlargement, no tenderness.  LUNGS: Normal breath sounds bilaterally, no wheezing, rales,rhonchi or crepitation. No use of accessory muscles of respiration.  CARDIOVASCULAR: S1, S2 normal. No murmurs, rubs, or gallops.  ABDOMEN: Soft, nontender, nondistended. Bowel sounds present. No organomegaly or mass.  EXTREMITIES: No pedal edema, cyanosis, or clubbing. Minimal  quadriceps tenderness with movement NEUROLOGIC: Cranial nerves II through XII are intact. Muscle strength 5/5 in all extremities. Sensation intact. Gait not checked.  PSYCHIATRIC: The patient is alert and oriented x 3.  SKIN: No obvious rash, lesion, or ulcer.    LABORATORY PANEL:   CBC Recent Labs  Lab 02/28/19 0336  WBC 10.1  HGB 13.5  HCT 39.5  PLT 176   ------------------------------------------------------------------------------------------------------------------  Chemistries  Recent Labs  Lab 02/28/19 0336  NA 138  K 4.0  CL 108  CO2 24  GLUCOSE 85  BUN 13  CREATININE 0.85  CALCIUM 8.6*  AST 1,558*  ALT 465*  ALKPHOS 39  BILITOT 0.7   ------------------------------------------------------------------------------------------------------------------  Cardiac Enzymes Recent Labs  Lab 02/27/19 1432  TROPONINI <0.03   ------------------------------------------------------------------------------------------------------------------  RADIOLOGY:  No results found.  EKG:   Orders placed or performed during the hospital encounter of 04/06/18  . EKG 12-Lead  . EKG 12-Lead  . ED EKG 12-Lead  . ED EKG 12-Lead  . EKG    ASSESSMENT AND PLAN:   23 year old male with past medical history significant for asthma brought in from prison secondary to significant bilateral thigh pain.  1.  Rhabdomyolysis-secondary to squats he was doing and present vigorously. -CPK still elevated and greater than 90,000.  Transaminases elevated as well. -Continue aggressive IV hydration and monitor.  Clinically improving  2.  Elevated transaminases-secondary to muscle injury.  Monitor  3.  Asthma-stable.  4.  DVT prophylaxis-Lovenox   All the records are reviewed and case discussed with Care Management/Social Workerr. Management plans discussed with the patient, family and they are in agreement.  CODE STATUS: Full Code  TOTAL TIME TAKING CARE OF THIS PATIENT: 29  minutes.   POSSIBLE D/C IN 1-2 DAYS, DEPENDING ON  CLINICAL CONDITION.   Enid Baasadhika Zayda Angell M.D on 02/28/2019 at 12:01 PM  Between 7am to 6pm - Pager - [phone]  After 6pm go to [email or web address] - password [email or web address]  [facility]  Office  [phone]  CC: Primary care physician; Patient, No Pcp Per

## 2019-03-01 LAB — COMPREHENSIVE METABOLIC PANEL
ALT: 364 U/L — ABNORMAL HIGH (ref 0–44)
AST: 847 U/L — ABNORMAL HIGH (ref 15–41)
Albumin: 3.3 g/dL — ABNORMAL LOW (ref 3.5–5.0)
Alkaline Phosphatase: 40 U/L (ref 38–126)
Anion gap: 7 (ref 5–15)
BUN: 13 mg/dL (ref 6–20)
CO2: 25 mmol/L (ref 22–32)
Calcium: 8.8 mg/dL — ABNORMAL LOW (ref 8.9–10.3)
Chloride: 107 mmol/L (ref 98–111)
Creatinine, Ser: 0.91 mg/dL (ref 0.61–1.24)
GFR calc Af Amer: >60 mL/min (ref >60)
GFR calc non Af Amer: >60 mL/min (ref >60)
Glucose, Bld: 80 mg/dL (ref 70–99)
Potassium: 3.9 mmol/L (ref 3.5–5.1)
Sodium: 139 mmol/L (ref 135–145)
Total Bilirubin: 0.3 mg/dL (ref 0.3–1.2)
Total Protein: 6.3 g/dL — ABNORMAL LOW (ref 6.5–8.1)

## 2019-03-01 LAB — CK: Total CK: 39234 U/L — ABNORMAL HIGH (ref 49–397)

## 2019-03-01 LAB — HIV ANTIBODY (ROUTINE TESTING W REFLEX): HIV Screen 4th Generation wRfx: NONREACTIVE

## 2019-03-01 NOTE — Discharge Summary (Signed)
[facility] - [place] at [facility]   PATIENT NAME: Blake Jennings    MR#:  [id number]  DATE OF BIRTH:  07/04/1996  DATE OF ADMISSION:  02/27/2019   ADMITTING PHYSICIAN: Ramonita LabAruna Gouru, MD  DATE OF DISCHARGE: 03/01/2019  2:16 PM  PRIMARY CARE PHYSICIAN: Patient, No Pcp Per   ADMISSION DIAGNOSIS:   Traumatic rhabdomyolysis, initial encounter (HCC) [T79.6XXA]  DISCHARGE DIAGNOSIS:   Active Problems:   Rhabdomyolysis   SECONDARY DIAGNOSIS:   Past Medical History:  Diagnosis Date  . Asthma     HOSPITAL COURSE:   23 year old male with past medical history significant for asthma brought in from prison secondary to significant bilateral thigh pain.  1.  Rhabdomyolysis-secondary to squats he was doing  vigorously prior to admission. -CPK  elevated and greater than 90,000.  Transaminases elevated as well. -Received aggressive IV fluids and significantly improving CPK and transaminases. -CPKs in the 30,000s, AST down to 800 and ALT in 300s at discharge.  Patient completely asymptomatic.  Continue to drink plenty of fluids as outpatient and repeat CPK and transaminases within 1 week-if no PCP, recommended to go to the urgent care -Continue aggressive IV hydration and monitor.  Clinically improving  2.  Elevated transaminases-secondary to muscle injury.    Improving  3.  Asthma-stable.  Patient has been ambulating well without any difficulty.  Will be discharged back to prison today  DISCHARGE CONDITIONS:   Guarded  CONSULTS OBTAINED:   None  DRUG ALLERGIES:   No Known Allergies DISCHARGE MEDICATIONS:   Allergies as of 03/01/2019   No Known Allergies     Medication List    You have not been prescribed any medications.      DISCHARGE INSTRUCTIONS:   1. Advised to drink plenty of fluids 2. F/u CPK and transaminases in 1 week  DIET:   Regular diet  ACTIVITY:   Activity as tolerated  OXYGEN:   Home Oxygen: No.  Oxygen  Delivery: room air  DISCHARGE LOCATION:   home   If you experience worsening of your admission symptoms, develop shortness of breath, life threatening emergency, suicidal or homicidal thoughts you must seek medical attention immediately by calling 911 or calling your MD immediately  if symptoms less severe.  You Must read complete instructions/literature along with all the possible adverse reactions/side effects for all the Medicines you take and that have been prescribed to you. Take any new Medicines after you have completely understood and accpet all the possible adverse reactions/side effects.   Please note  You were cared for by a hospitalist during your hospital stay. If you have any questions about your discharge medications or the care you received while you were in the hospital after you are discharged, you can call the unit and asked to speak with the hospitalist on call if the hospitalist that took care of you is not available. Once you are discharged, your primary care physician will handle any further medical issues. Please note that NO REFILLS for any discharge medications will be authorized once you are discharged, as it is imperative that you return to your primary care physician (or establish a relationship with a primary care physician if you do not have one) for your aftercare needs so that they can reassess your need for medications and monitor your lab values.    On the day of Discharge:  VITAL SIGNS:   Blood pressure 113/73, pulse 60, temperature 97.7 F (36.5 C), temperature source Oral, resp. rate 16,  height 5\' 10"  (1.778 m), weight 68 kg, SpO2 100 %.  PHYSICAL EXAMINATION:      GENERAL:  23 y.o.-year-old patient lying in the bed with no acute distress.  EYES: Pupils equal, round, reactive to light and accommodation. No scleral icterus. Extraocular muscles intact.  HEENT: Head atraumatic, normocephalic. Oropharynx and nasopharynx clear.  NECK:  Supple, no jugular  venous distention. No thyroid enlargement, no tenderness.  LUNGS: Normal breath sounds bilaterally, no wheezing, rales,rhonchi or crepitation. No use of accessory muscles of respiration.  CARDIOVASCULAR: S1, S2 normal. No murmurs, rubs, or gallops.  ABDOMEN: Soft, nontender, nondistended. Bowel sounds present. No organomegaly or mass.  EXTREMITIES: No pedal edema, cyanosis, or clubbing. Minimal quadriceps tenderness with movement NEUROLOGIC: Cranial nerves II through XII are intact. Muscle strength 5/5 in all extremities. Sensation intact. Gait not checked.  PSYCHIATRIC: The patient is alert and oriented x 3.  SKIN: No obvious rash, lesion, or ulcer.  DATA REVIEW:   CBC Recent Labs  Lab 02/28/19 0336  WBC 10.1  HGB 13.5  HCT 39.5  PLT 176    Chemistries  Recent Labs  Lab 03/01/19 0349  NA 139  K 3.9  CL 107  CO2 25  GLUCOSE 80  BUN 13  CREATININE 0.91  CALCIUM 8.8*  AST 847*  ALT 364*  ALKPHOS 40  BILITOT 0.3     Microbiology Results  Results for orders placed or performed during the hospital encounter of 02/27/19  MRSA PCR Screening     Status: None   Collection Time: 02/27/19  6:25 PM  Result Value Ref Range Status   MRSA by PCR NEGATIVE NEGATIVE Final    Comment:        The GeneXpert MRSA Assay (FDA approved for NASAL specimens only), is one component of a comprehensive MRSA colonization surveillance program. It is not intended to diagnose MRSA infection nor to guide or monitor treatment for MRSA infections. Performed at [facility], [address], [place], [place] [address]     RADIOLOGY:  No results found.   Management plans discussed with the patient, family and they are in agreement.  CODE STATUS:     Code Status Orders  (From admission, onward)         Start     Ordered   02/27/19 1805  Full code  Continuous     02/27/19 1804        Code Status History    This patient has a current code status but no historical code  status.      TOTAL TIME TAKING CARE OF THIS PATIENT: 38 minutes.    Enid Baas M.D on 03/01/2019 at 2:38 PM  Between 7am to 6pm - Pager - [phone]  After 6pm go to [email or web address] - Social research officer, government  [facility]  Office  [phone]  CC: Primary care physician; Patient, No Pcp Per   Note: This dictation was prepared with Dragon dictation along with smaller phrase technology. Any transcriptional errors that result from this process are unintentional.

## 2019-03-01 NOTE — Discharge Instructions (Signed)
1. NEED TO DRINK PLENTY OF FLUIDS

## 2019-10-13 IMAGING — CR DG CHEST 2V
2 series · 2 of 2 positions shown · non-contrast
Comparison: 02/13/2014

CLINICAL DATA: Cough.  Vomiting.

EXAM:
CHEST - 2 VIEW

[w chest pa]
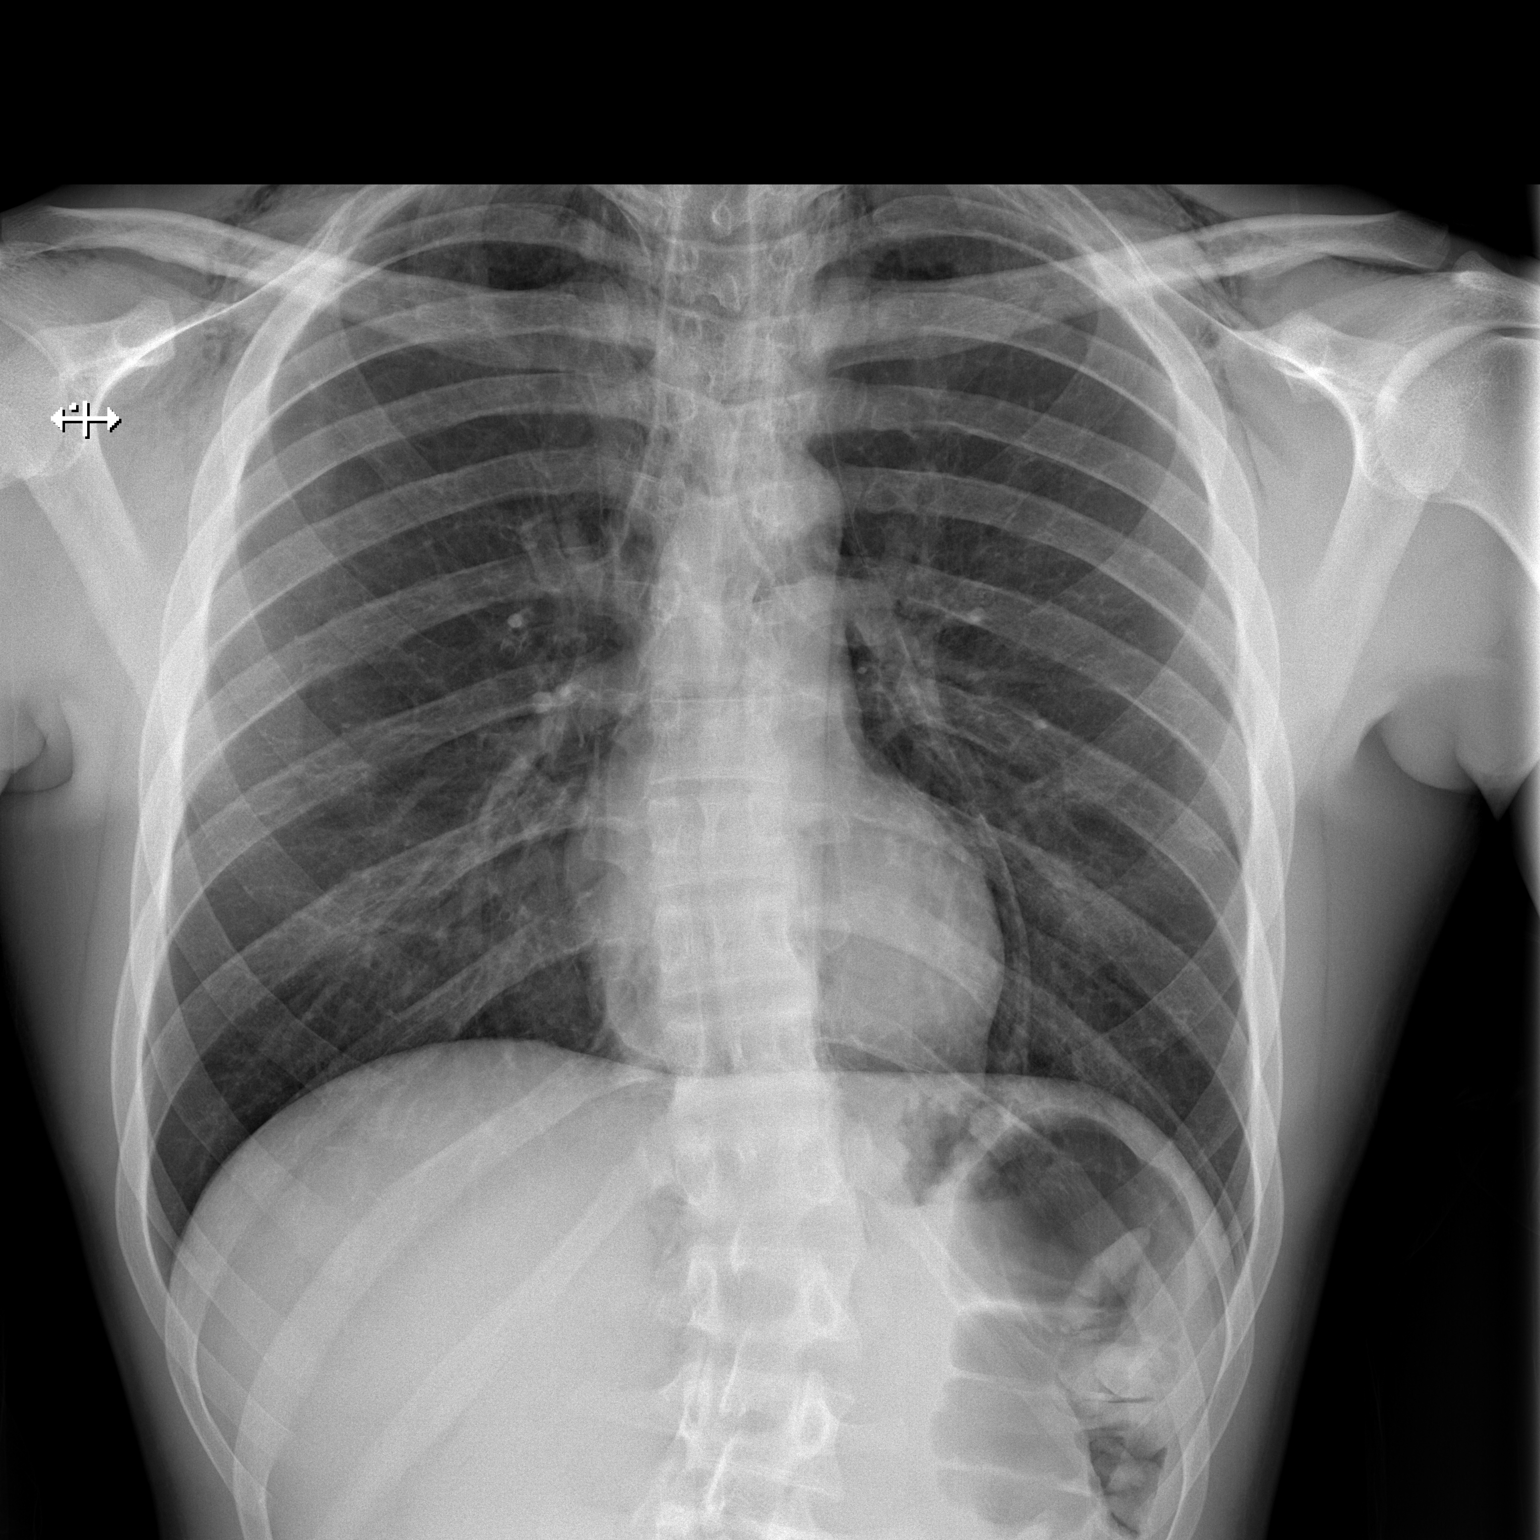

[w chest lat]
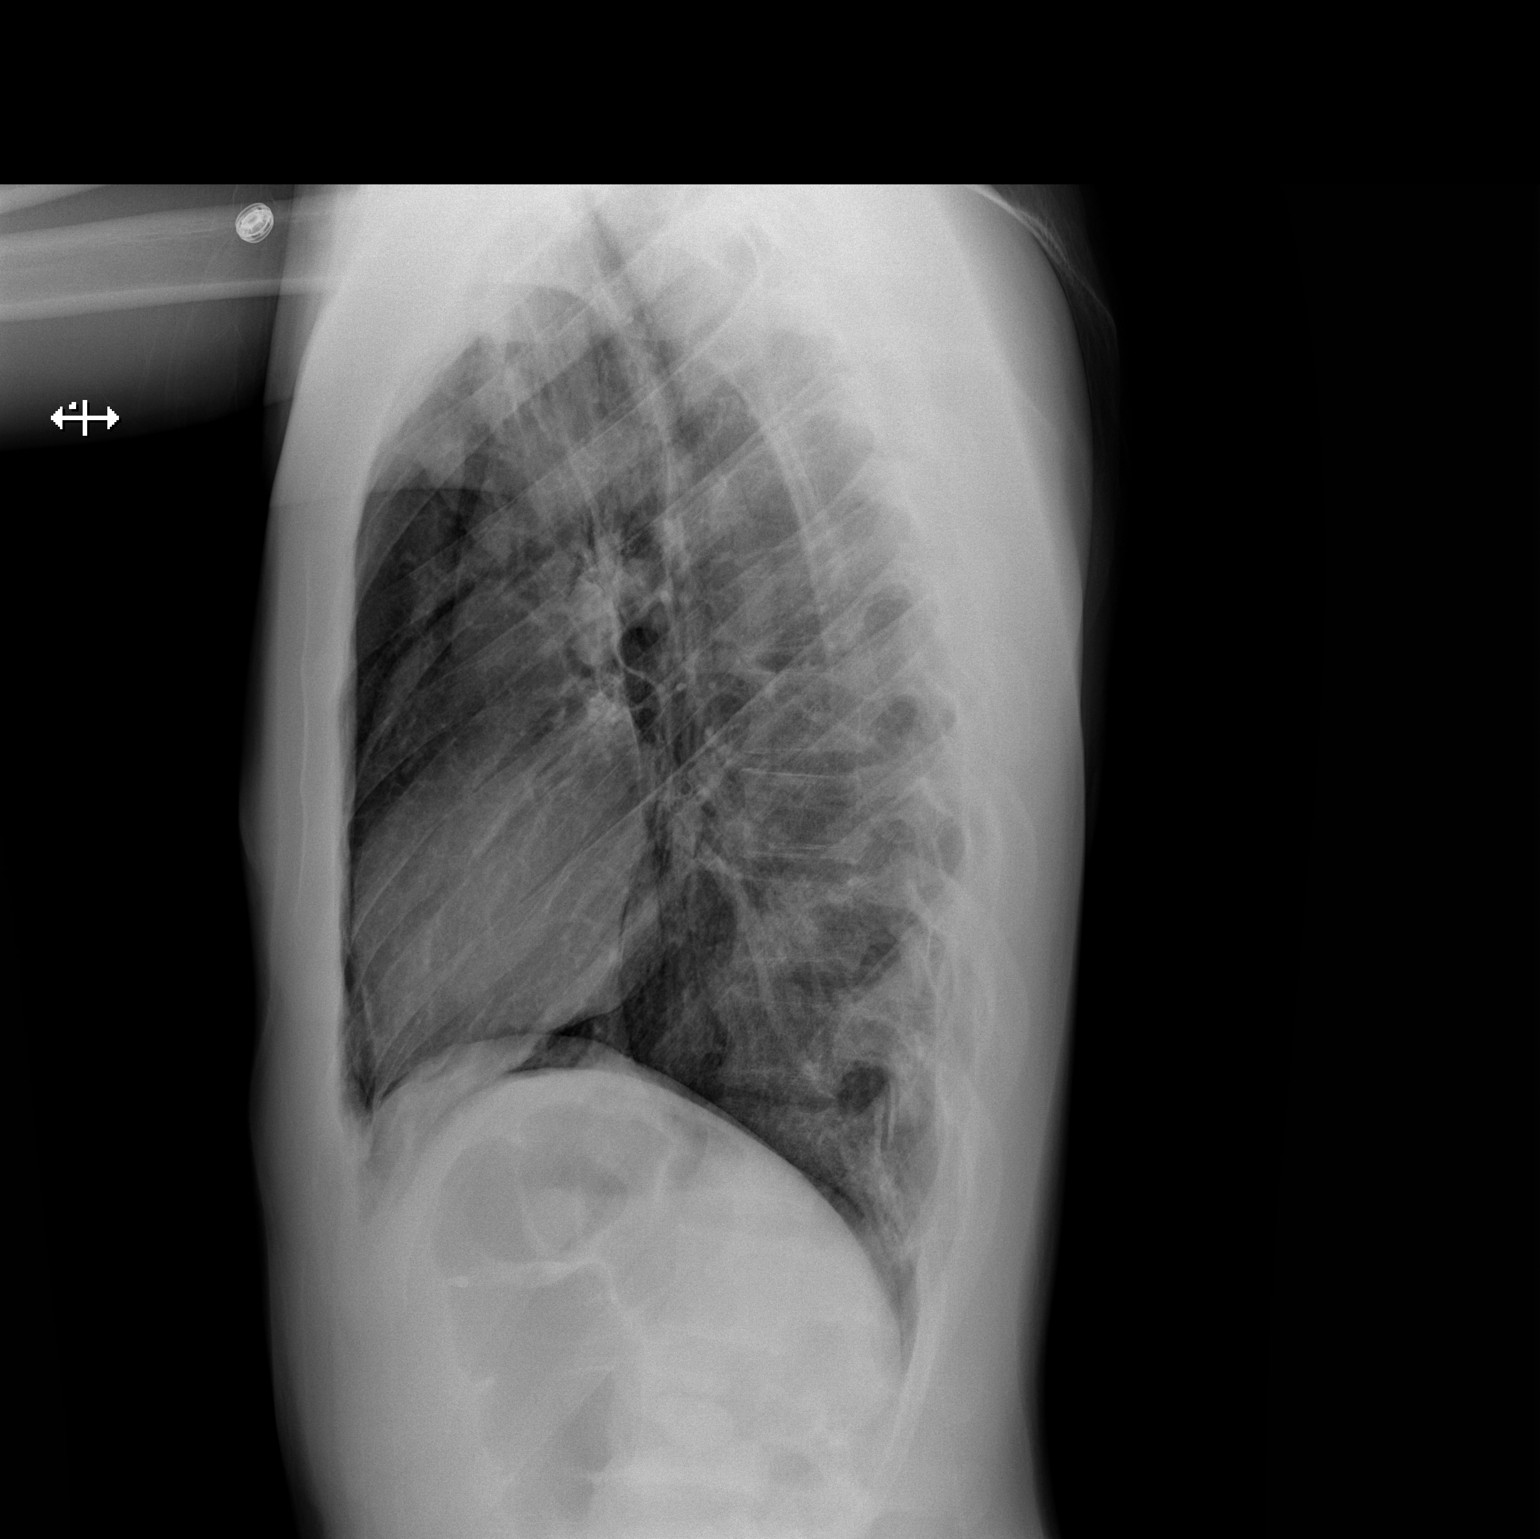

[2 of 2 positions shown; findings below may reference images not displayed]

FINDINGS: Extensive pneumomediastinum tracking into the soft tissues of the
supraclavicular fossa and neck. Barotrauma or esophageal perforation
are both considered in the setting of vomiting. Lucency at the left
apex is stable from prior; no visible pneumothorax. The lungs are
clear and well aerated.

Mild lumbar scoliosis.

Normal heart size and aortic contours.

These results were called by telephone at the time of interpretation
on 04/06/2018 at [DATE] to Dr. PYRO MISAEL , who verbally
acknowledged these results.
IMPRESSION: 1. Extensive pneumomediastinum with soft tissue emphysema in the
neck and supraclavicular fossae, as above.
2. No visible pneumothorax or lung opacity.

## 2019-10-17 IMAGING — CR DG CHEST 2V
2 series · 2 of 2 positions shown · non-contrast
Comparison: CT chest April 06, 2018

CLINICAL DATA: Pneumomediastinum.

EXAM:
CHEST - 2 VIEW

[w chest pa]
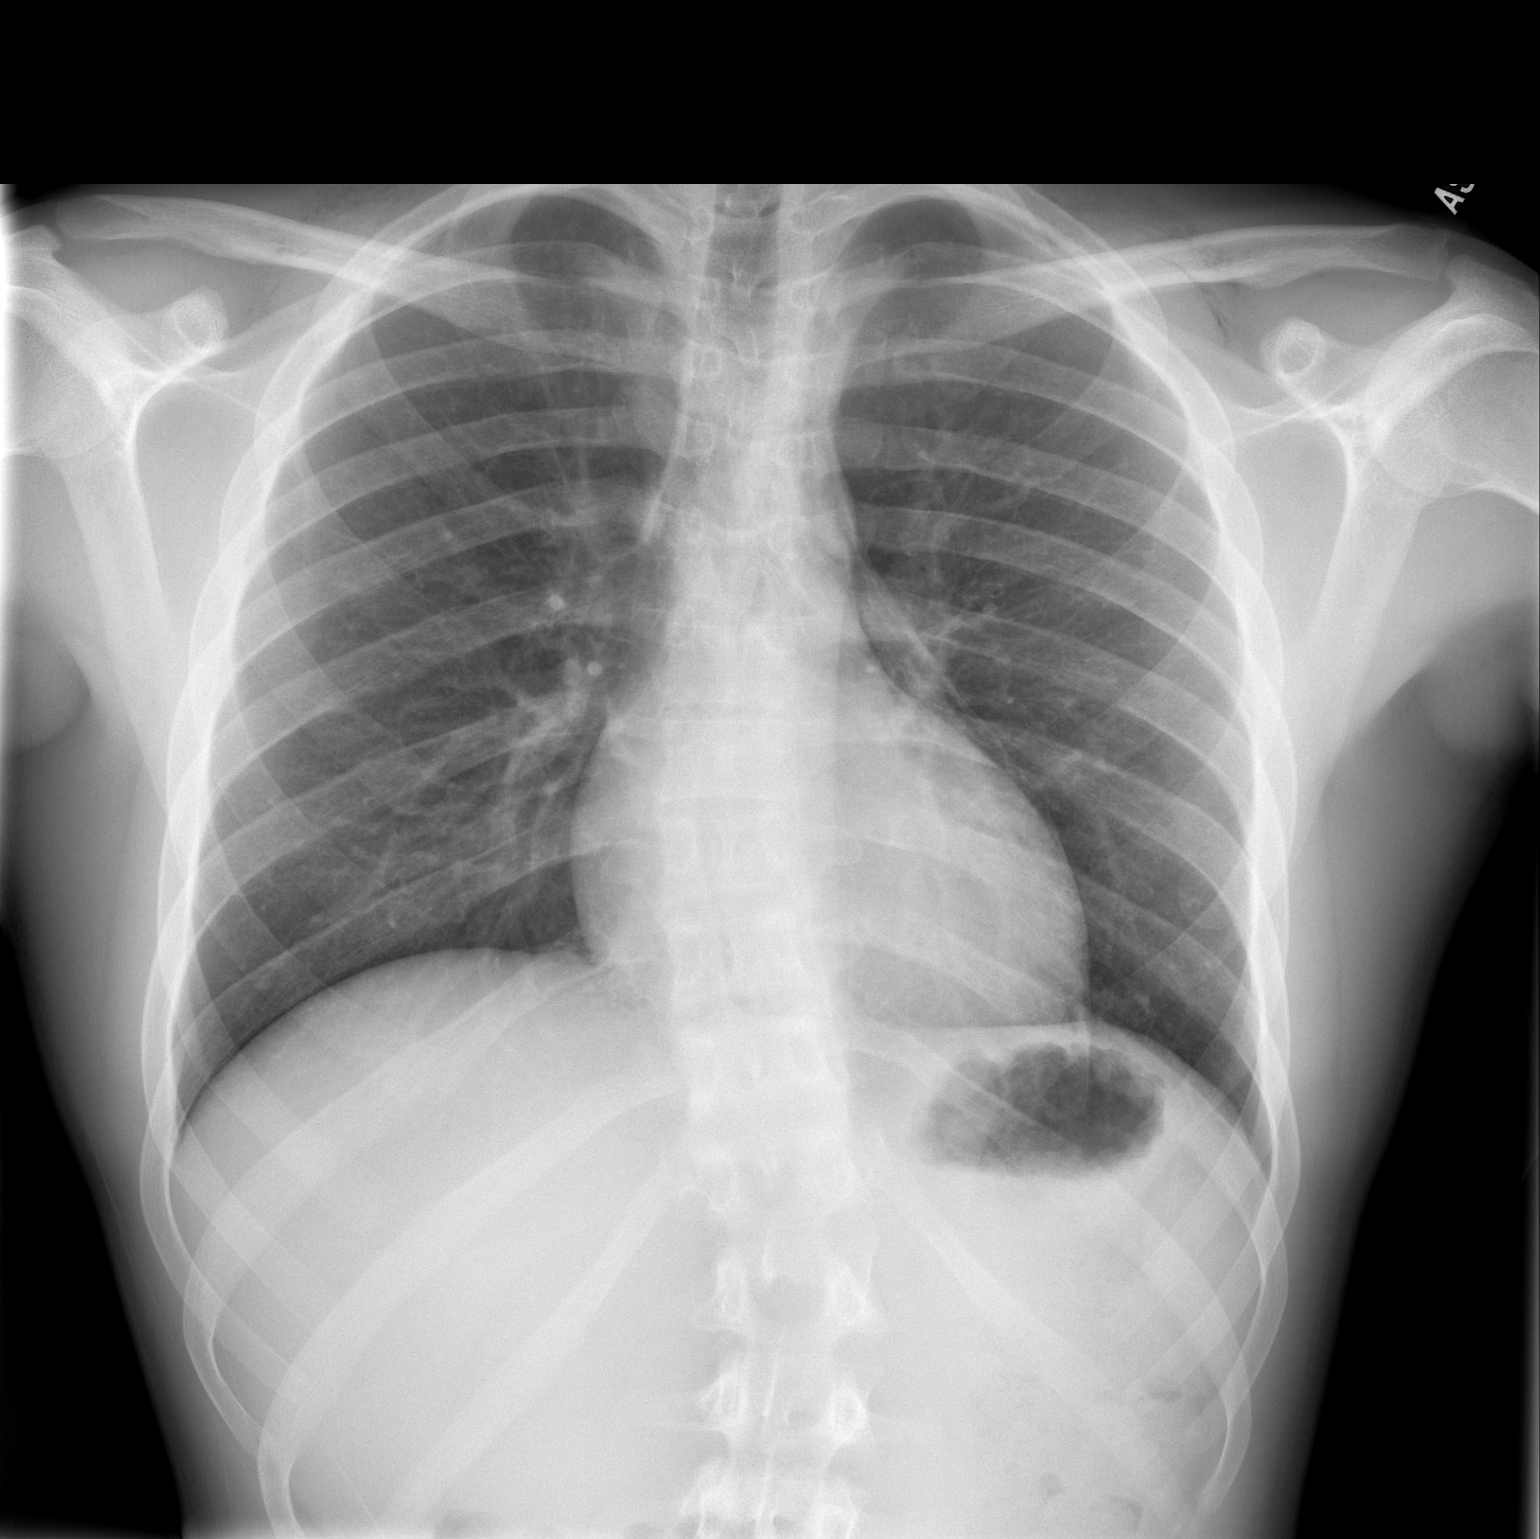

[w chest lat]
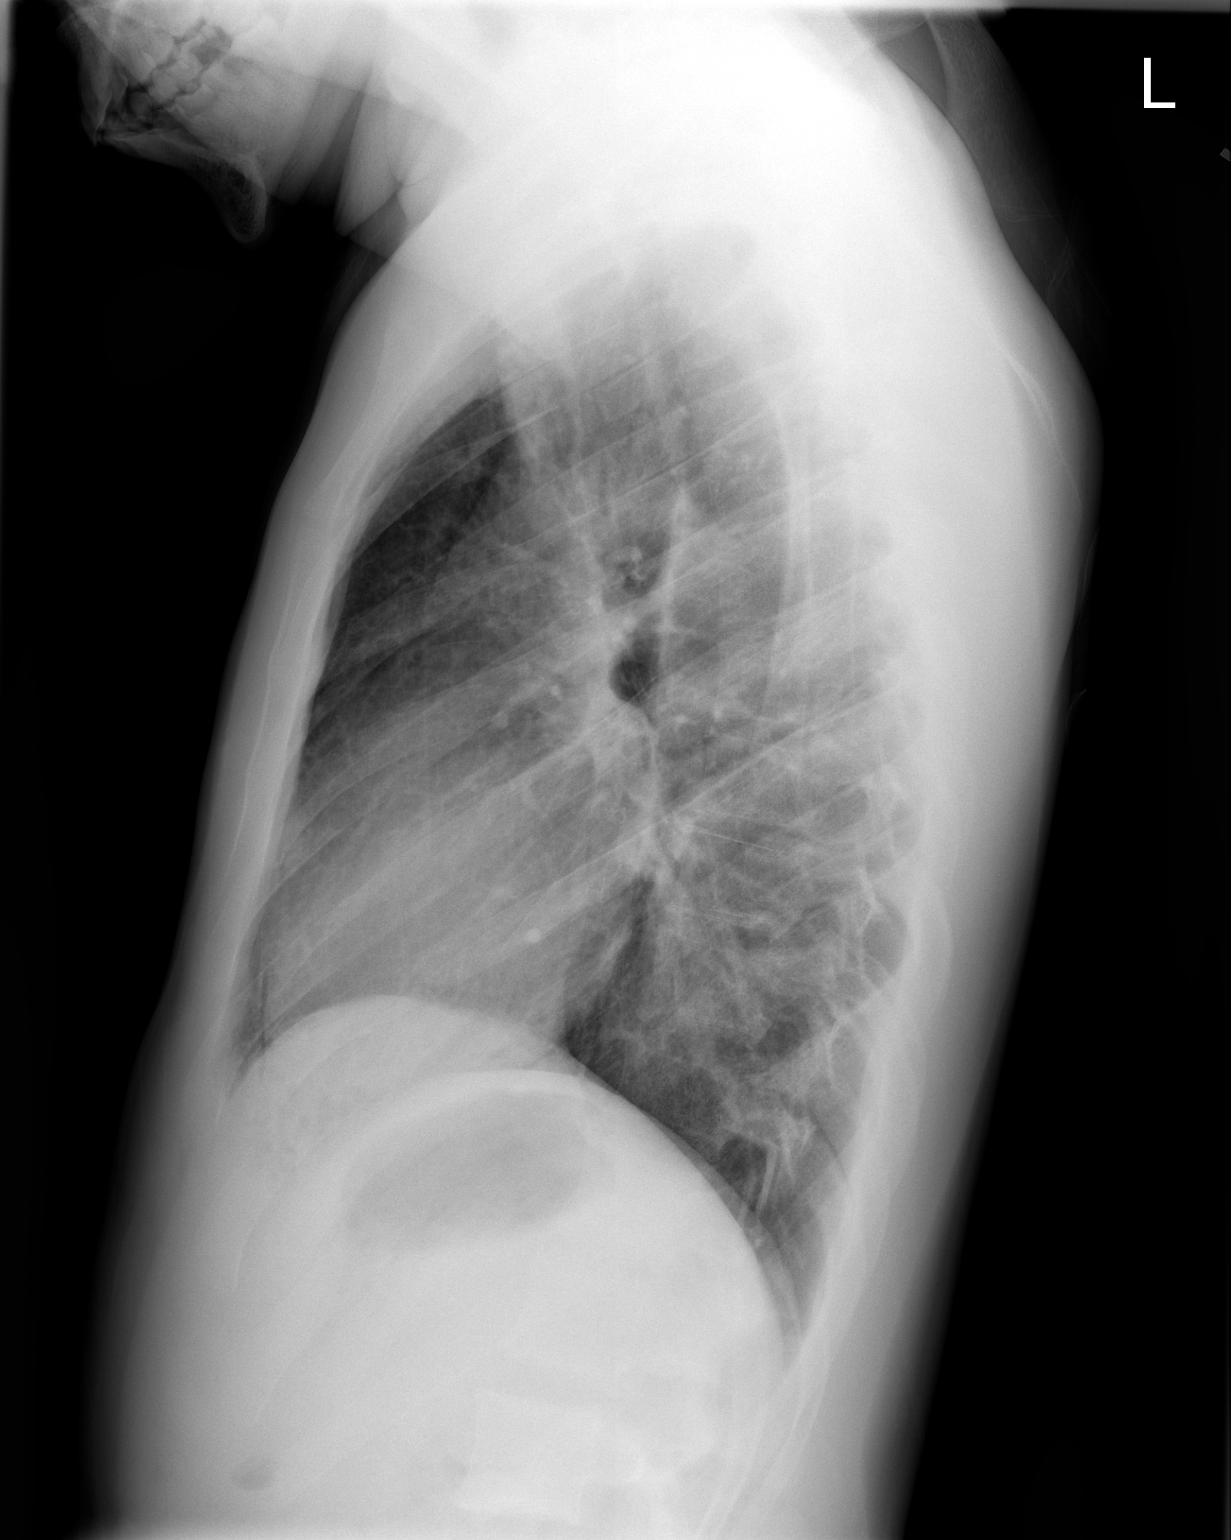

[2 of 2 positions shown; findings below may reference images not displayed]

FINDINGS: Cardiomediastinal silhouette is normal. Small volume residual
pneumomediastinum. No pleural effusions or focal consolidations.
Trachea projects midline and there is no pneumothorax. Minimal
residual subcutaneous emphysema bilateral chest wall. Osseous
structures are normal.
IMPRESSION: Small volume residual pneumomediastinum and subcutaneous emphysema.
